# Patient Record
Sex: Female | Born: 1955 | Hispanic: No | Marital: Married | Smoking: Never smoker
Health system: Southern US, Community
[De-identification: ages and names within clinical notes are randomized; demographics above are authoritative.]

## PROBLEM LIST (undated history)

## (undated) DIAGNOSIS — Z6841 Body Mass Index (BMI) 40.0 and over, adult: Secondary | ICD-10-CM

## (undated) DIAGNOSIS — J45909 Unspecified asthma, uncomplicated: Secondary | ICD-10-CM

## (undated) DIAGNOSIS — I10 Essential (primary) hypertension: Secondary | ICD-10-CM

## (undated) DIAGNOSIS — E7800 Pure hypercholesterolemia, unspecified: Secondary | ICD-10-CM

## (undated) DIAGNOSIS — I350 Nonrheumatic aortic (valve) stenosis: Secondary | ICD-10-CM

## (undated) HISTORY — PX: TOTAL ABDOMINAL HYSTERECTOMY W/ BILATERAL SALPINGOOPHORECTOMY: SHX83

## (undated) HISTORY — DX: Body Mass Index (BMI) 40.0 and over, adult: Z684

## (undated) HISTORY — PX: DILATION AND CURETTAGE, DIAGNOSTIC / THERAPEUTIC: SUR384

## (undated) HISTORY — DX: Morbid (severe) obesity due to excess calories: E66.01

## (undated) HISTORY — DX: Nonrheumatic aortic (valve) stenosis: I35.0

---

## 2003-06-22 ENCOUNTER — Emergency Department (HOSPITAL_COMMUNITY): Admission: EM | Admit: 2003-06-22 | Discharge: 2003-06-22 | Payer: Self-pay | Admitting: Emergency Medicine

## 2003-06-24 ENCOUNTER — Ambulatory Visit (HOSPITAL_COMMUNITY): Admission: RE | Admit: 2003-06-24 | Discharge: 2003-06-24 | Payer: Self-pay | Admitting: Podiatry

## 2005-03-01 ENCOUNTER — Encounter (INDEPENDENT_AMBULATORY_CARE_PROVIDER_SITE_OTHER): Payer: Self-pay | Admitting: Family Medicine

## 2005-03-01 LAB — CONVERTED CEMR LAB: Microalbumin U total vol: 0.34 mg/L

## 2005-03-08 ENCOUNTER — Emergency Department (HOSPITAL_COMMUNITY): Admission: EM | Admit: 2005-03-08 | Discharge: 2005-03-09 | Payer: Self-pay | Admitting: Emergency Medicine

## 2005-03-30 ENCOUNTER — Ambulatory Visit: Payer: Self-pay | Admitting: Family Medicine

## 2005-04-02 ENCOUNTER — Encounter (INDEPENDENT_AMBULATORY_CARE_PROVIDER_SITE_OTHER): Payer: Self-pay | Admitting: Family Medicine

## 2005-04-05 ENCOUNTER — Ambulatory Visit (HOSPITAL_COMMUNITY): Admission: RE | Admit: 2005-04-05 | Discharge: 2005-04-05 | Payer: Self-pay | Admitting: Family Medicine

## 2005-05-01 ENCOUNTER — Encounter (INDEPENDENT_AMBULATORY_CARE_PROVIDER_SITE_OTHER): Payer: Self-pay | Admitting: Family Medicine

## 2005-05-01 ENCOUNTER — Ambulatory Visit: Payer: Self-pay | Admitting: Family Medicine

## 2005-06-13 ENCOUNTER — Ambulatory Visit: Payer: Self-pay | Admitting: Family Medicine

## 2005-06-13 ENCOUNTER — Ambulatory Visit: Payer: Self-pay | Admitting: *Deleted

## 2005-07-13 ENCOUNTER — Ambulatory Visit: Payer: Self-pay | Admitting: Family Medicine

## 2006-03-14 ENCOUNTER — Emergency Department (HOSPITAL_COMMUNITY): Admission: EM | Admit: 2006-03-14 | Discharge: 2006-03-15 | Payer: Self-pay | Admitting: Emergency Medicine

## 2006-04-30 ENCOUNTER — Ambulatory Visit: Payer: Self-pay | Admitting: Family Medicine

## 2006-08-09 ENCOUNTER — Encounter (INDEPENDENT_AMBULATORY_CARE_PROVIDER_SITE_OTHER): Payer: Self-pay | Admitting: Nurse Practitioner

## 2006-08-09 ENCOUNTER — Ambulatory Visit: Payer: Self-pay | Admitting: Internal Medicine

## 2006-08-09 LAB — CONVERTED CEMR LAB
Basophils Absolute: 0 10*3/uL (ref 0.0–0.1)
Basophils Relative: 1 % (ref 0–1)
Eosinophils Absolute: 0.1 10*3/uL (ref 0.0–0.7)
HCT: 39.3 % (ref 36.0–46.0)
Hemoglobin: 11.9 g/dL — ABNORMAL LOW (ref 12.0–15.0)
Monocytes Absolute: 0.6 10*3/uL (ref 0.2–0.7)
Monocytes Relative: 8 % (ref 3–11)
RBC: 4.53 M/uL (ref 3.87–5.11)

## 2006-08-20 ENCOUNTER — Encounter (INDEPENDENT_AMBULATORY_CARE_PROVIDER_SITE_OTHER): Payer: Self-pay | Admitting: Family Medicine

## 2006-08-20 DIAGNOSIS — E785 Hyperlipidemia, unspecified: Secondary | ICD-10-CM | POA: Insufficient documentation

## 2006-08-20 DIAGNOSIS — J309 Allergic rhinitis, unspecified: Secondary | ICD-10-CM | POA: Insufficient documentation

## 2006-08-20 DIAGNOSIS — N390 Urinary tract infection, site not specified: Secondary | ICD-10-CM | POA: Insufficient documentation

## 2006-08-20 DIAGNOSIS — I10 Essential (primary) hypertension: Secondary | ICD-10-CM | POA: Insufficient documentation

## 2006-08-20 DIAGNOSIS — J45909 Unspecified asthma, uncomplicated: Secondary | ICD-10-CM | POA: Insufficient documentation

## 2006-10-10 ENCOUNTER — Ambulatory Visit: Payer: Self-pay | Admitting: Nurse Practitioner

## 2006-10-17 ENCOUNTER — Encounter (INDEPENDENT_AMBULATORY_CARE_PROVIDER_SITE_OTHER): Payer: Self-pay | Admitting: Nurse Practitioner

## 2006-10-17 ENCOUNTER — Ambulatory Visit: Payer: Self-pay | Admitting: Family Medicine

## 2006-10-17 LAB — CONVERTED CEMR LAB
Basophils Relative: 1 % (ref 0–1)
Eosinophils Relative: 2 % (ref 0–5)
Lymphocytes Relative: 26 % (ref 12–46)
Monocytes Absolute: 0.4 10*3/uL (ref 0.2–0.7)
Neutro Abs: 5.2 10*3/uL (ref 1.7–7.7)
Platelets: 327 10*3/uL (ref 150–400)
RDW: 14.2 % — ABNORMAL HIGH (ref 11.5–14.0)

## 2006-10-18 ENCOUNTER — Encounter (INDEPENDENT_AMBULATORY_CARE_PROVIDER_SITE_OTHER): Payer: Self-pay | Admitting: Nurse Practitioner

## 2007-03-03 ENCOUNTER — Encounter (INDEPENDENT_AMBULATORY_CARE_PROVIDER_SITE_OTHER): Payer: Self-pay | Admitting: Family Medicine

## 2007-03-03 ENCOUNTER — Emergency Department (HOSPITAL_COMMUNITY): Admission: EM | Admit: 2007-03-03 | Discharge: 2007-03-03 | Payer: Self-pay | Admitting: Emergency Medicine

## 2007-03-04 ENCOUNTER — Telehealth (INDEPENDENT_AMBULATORY_CARE_PROVIDER_SITE_OTHER): Payer: Self-pay | Admitting: *Deleted

## 2007-03-05 ENCOUNTER — Ambulatory Visit: Payer: Self-pay | Admitting: Family Medicine

## 2007-03-05 DIAGNOSIS — N950 Postmenopausal bleeding: Secondary | ICD-10-CM | POA: Insufficient documentation

## 2007-03-06 ENCOUNTER — Other Ambulatory Visit: Admission: RE | Admit: 2007-03-06 | Discharge: 2007-03-06 | Payer: Self-pay | Admitting: Gynecology

## 2007-03-06 ENCOUNTER — Ambulatory Visit: Payer: Self-pay | Admitting: Gynecology

## 2007-03-10 ENCOUNTER — Telehealth (INDEPENDENT_AMBULATORY_CARE_PROVIDER_SITE_OTHER): Payer: Self-pay | Admitting: Family Medicine

## 2007-03-11 ENCOUNTER — Inpatient Hospital Stay (HOSPITAL_COMMUNITY): Admission: AD | Admit: 2007-03-11 | Discharge: 2007-03-11 | Payer: Self-pay | Admitting: Obstetrics & Gynecology

## 2007-03-20 ENCOUNTER — Ambulatory Visit: Payer: Self-pay | Admitting: Obstetrics & Gynecology

## 2007-04-01 ENCOUNTER — Ambulatory Visit: Payer: Self-pay | Admitting: Obstetrics & Gynecology

## 2007-04-01 ENCOUNTER — Encounter: Payer: Self-pay | Admitting: Obstetrics & Gynecology

## 2007-04-01 ENCOUNTER — Ambulatory Visit (HOSPITAL_COMMUNITY): Admission: RE | Admit: 2007-04-01 | Discharge: 2007-04-01 | Payer: Self-pay | Admitting: Obstetrics & Gynecology

## 2007-04-25 ENCOUNTER — Encounter: Payer: Self-pay | Admitting: Obstetrics & Gynecology

## 2007-04-25 ENCOUNTER — Ambulatory Visit: Payer: Self-pay | Admitting: Obstetrics & Gynecology

## 2007-05-08 ENCOUNTER — Ambulatory Visit (HOSPITAL_COMMUNITY): Admission: RE | Admit: 2007-05-08 | Discharge: 2007-05-08 | Payer: Self-pay | Admitting: Obstetrics & Gynecology

## 2007-09-09 ENCOUNTER — Telehealth (INDEPENDENT_AMBULATORY_CARE_PROVIDER_SITE_OTHER): Payer: Self-pay | Admitting: *Deleted

## 2007-12-11 IMAGING — MG MS DIGITAL SCREENING BILAT
4 series · 4 of 4 positions shown · non-contrast
Comparison: none

SCREENING MAMMOGRAM:
There is a fibroglandular pattern.  No masses or malignant type calcifications are identified.

[R CC]
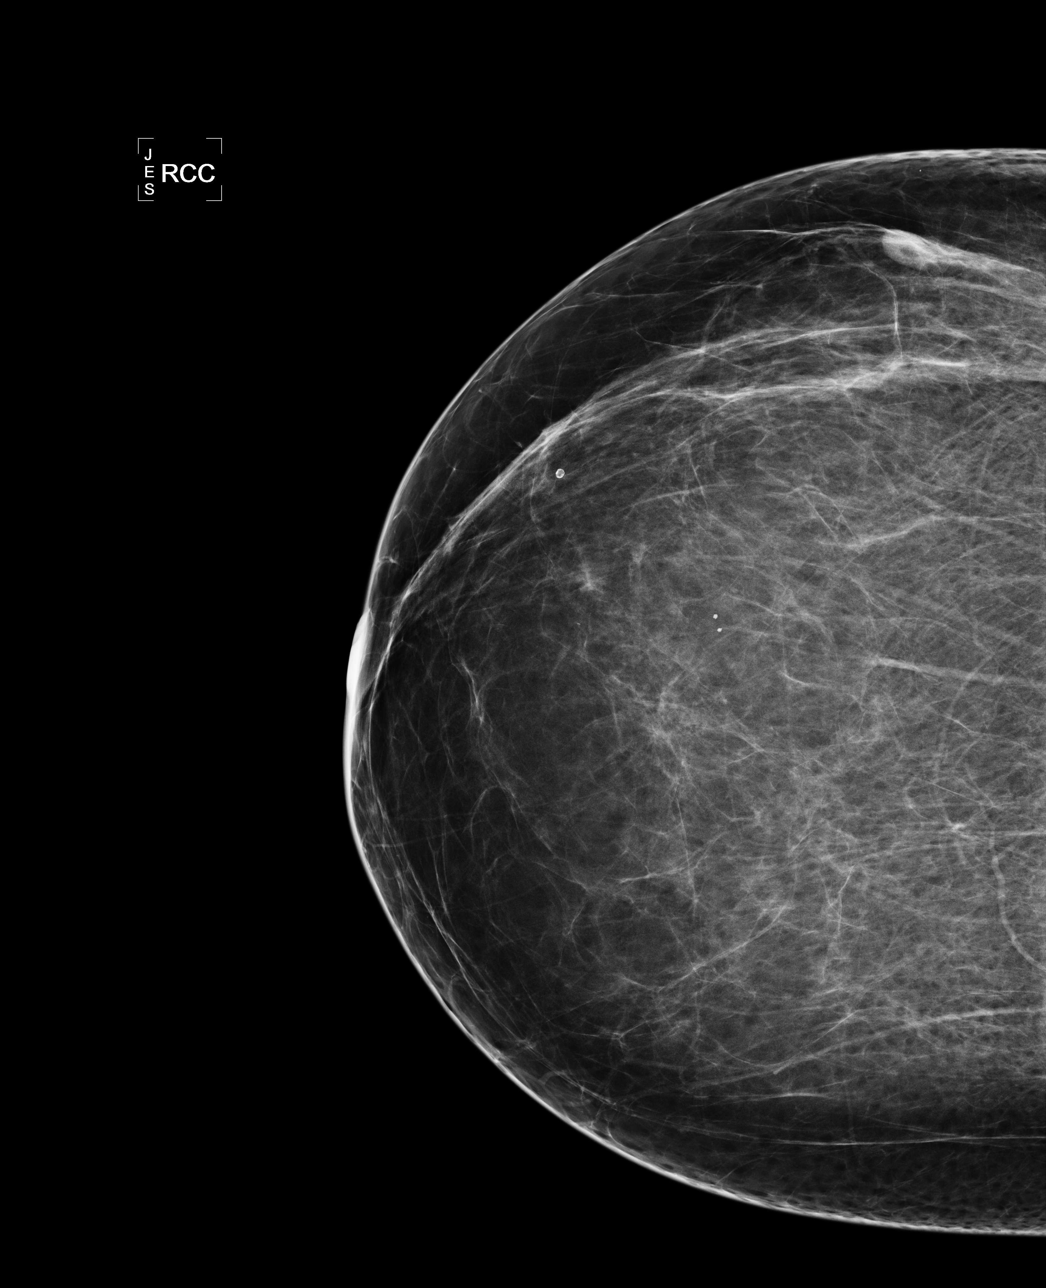

[R MLO]
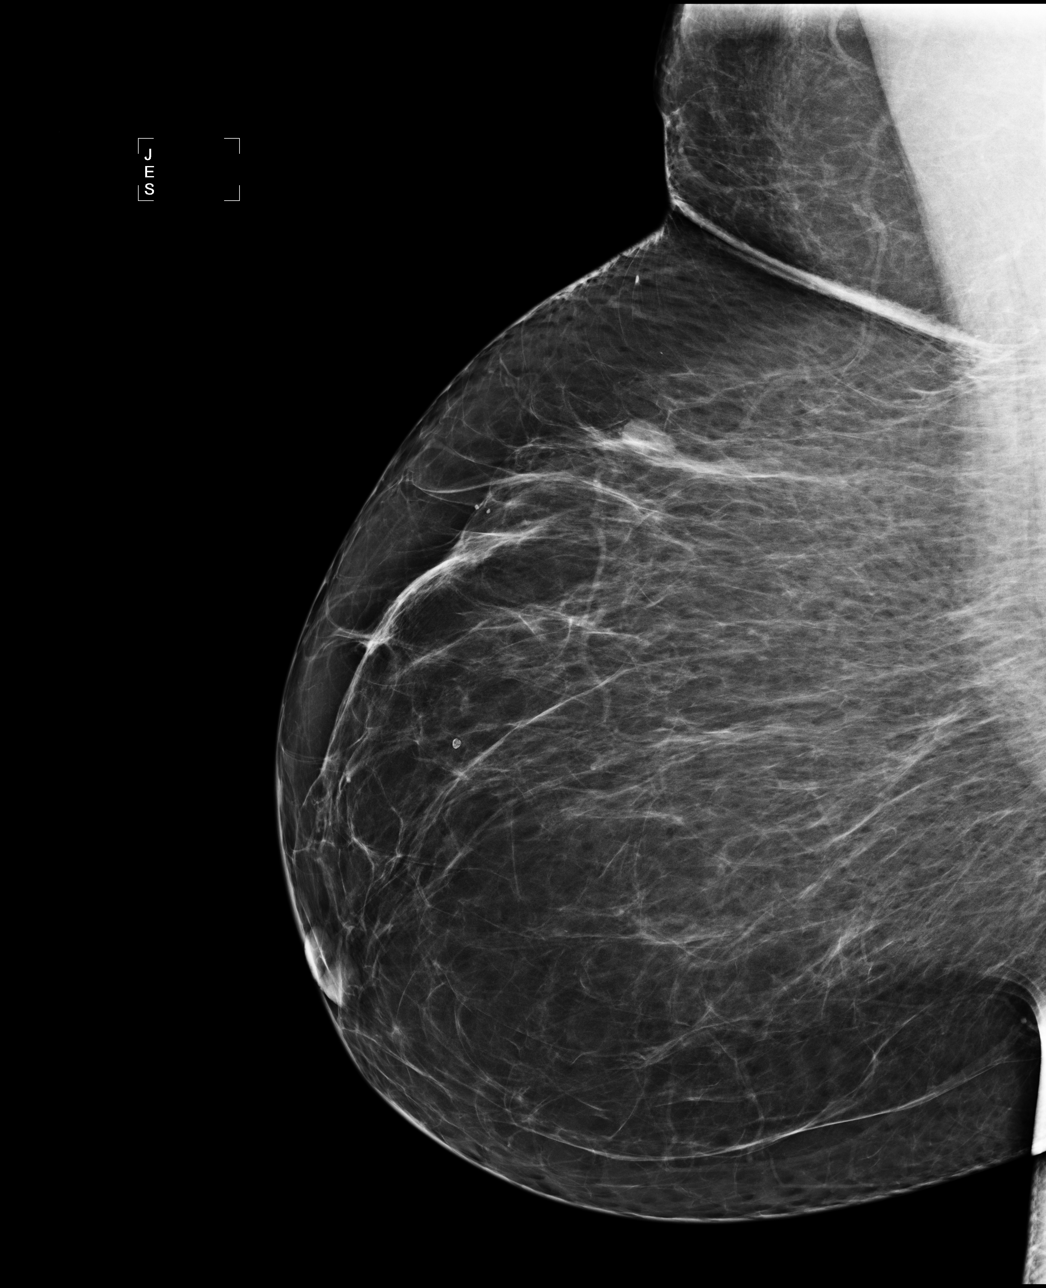

[L CC]
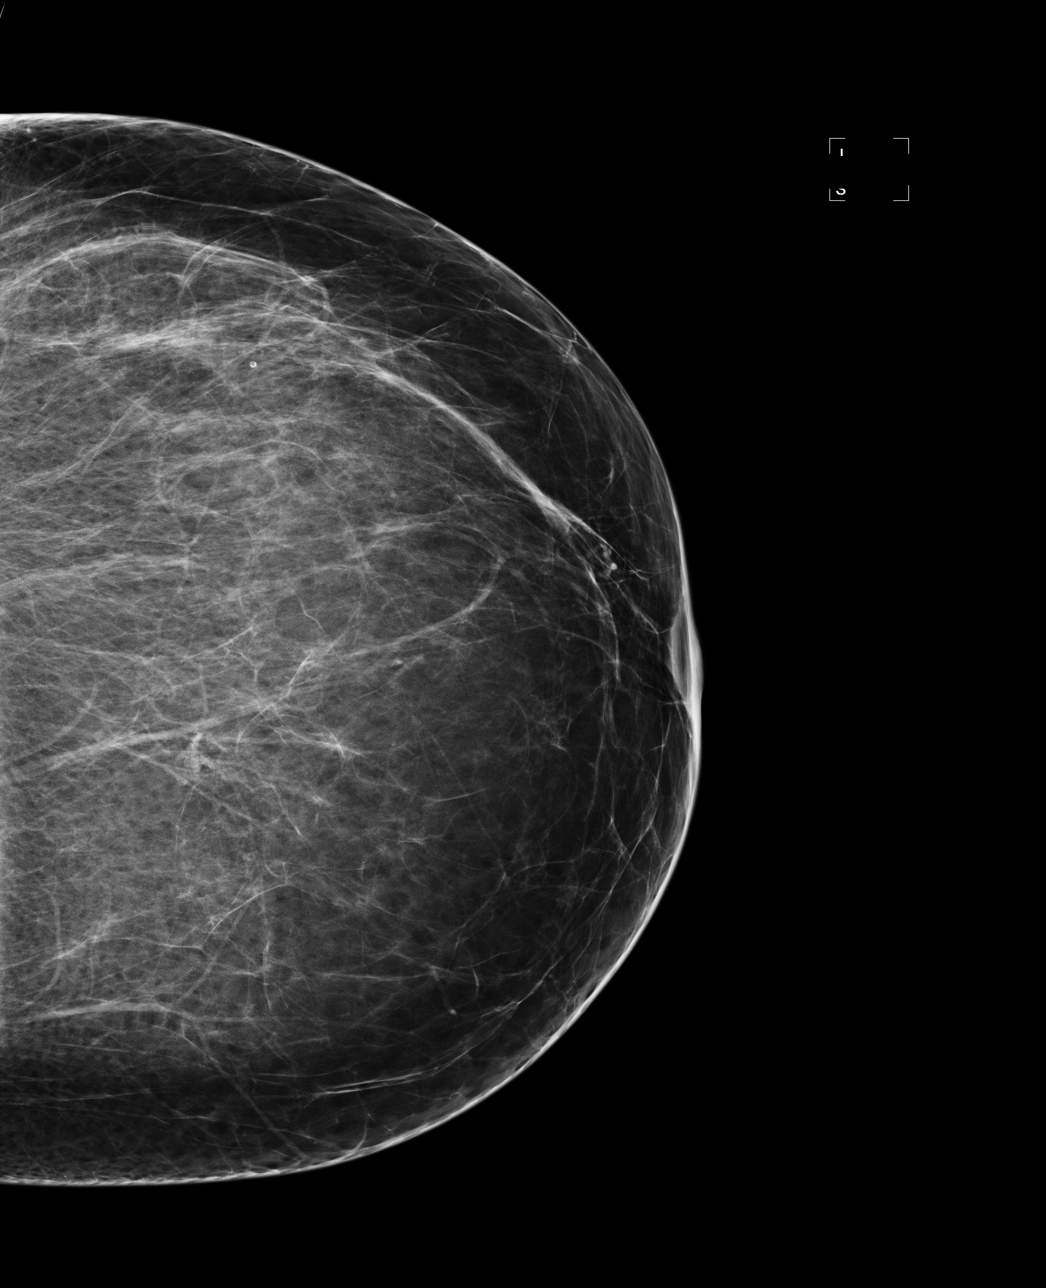

[L MLO]
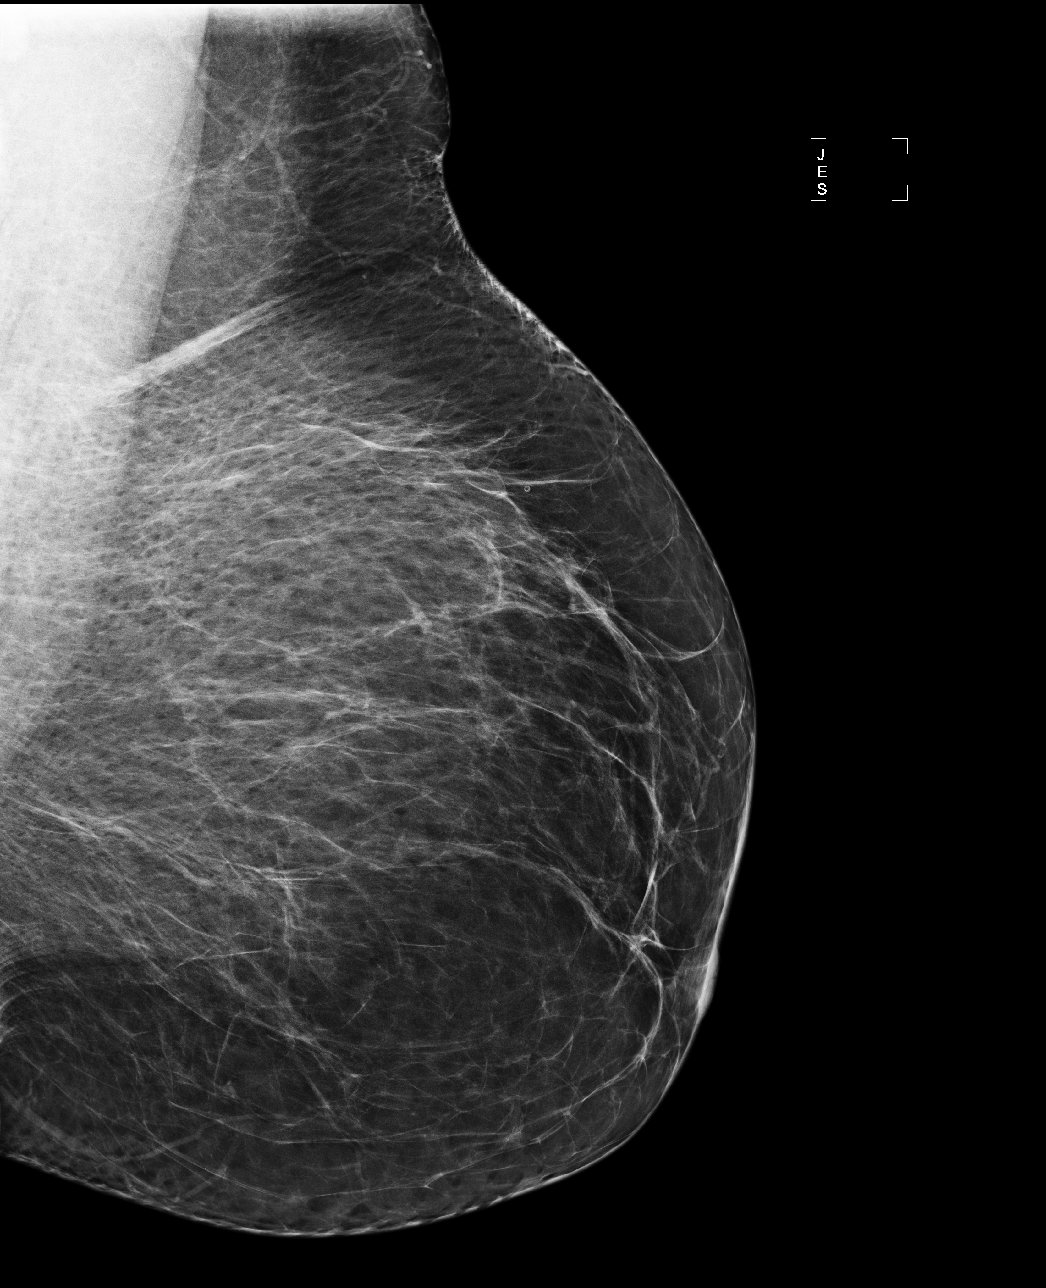

[4 of 4 positions shown; findings below may reference images not displayed]

IMPRESSION: No specific mammographic evidence of malignancy.  Consider screening MRI every 1-2 years given the 
patient's high risk for breast cancer. Next screening mammogram is recommended in one year.

ASSESSMENT: Negative - BI-RADS 1

Screening mammogram in 1 year.

## 2008-05-04 ENCOUNTER — Telehealth (INDEPENDENT_AMBULATORY_CARE_PROVIDER_SITE_OTHER): Payer: Self-pay | Admitting: Family Medicine

## 2008-11-20 ENCOUNTER — Inpatient Hospital Stay (HOSPITAL_COMMUNITY): Admission: AD | Admit: 2008-11-20 | Discharge: 2008-11-20 | Payer: Self-pay | Admitting: Obstetrics & Gynecology

## 2009-11-07 IMAGING — US US PELVIS COMPLETE MODIFY
1 series · 13 of 25 positions shown · non-contrast
Comparison: none

Colon postmenopausal bleeding

ULTRASOUND PELVIS TRANSABDOMINAL COMPLETE MODIFIED:
ULTRASOUND PELVIS TRANSVAGINAL NON-OB:
Transabdominal and endovaginal sonography of pelvis performed.
Uterus, ovaries, adnexal regions, and pelvic cul-de-sac assessed.
Uterus enlarged measuring 14.7 cm length x 6.2 cm AP x 6.5 cm transverse pre-
Endometrial complex markedly thickened and poorly defined, 15 mm diameter.
Cannot exclude malignancy with this appearance.
Additionally, a poorly defined slightly echogenic focus with enhanced
through-transmission is seen at the posterior aspect of the mid uterine segment,
6 x 6 x 7 mm, uncertain significance.
Tiny nabothian cysts and cervix.
No endometrial fluid or free pelvic fluid.
Right ovary is not visualized on either transabdominal or endovaginal imaging.
Left ovary measures 2.8 x 2.6 x 2.3 cm and contains a 1.7 cm diameter cyst with
simple features.
No other adnexal masses.

[Series 1: unknown · 0.33mm/px · 13 of 40 slices shown]
[im 1/40]
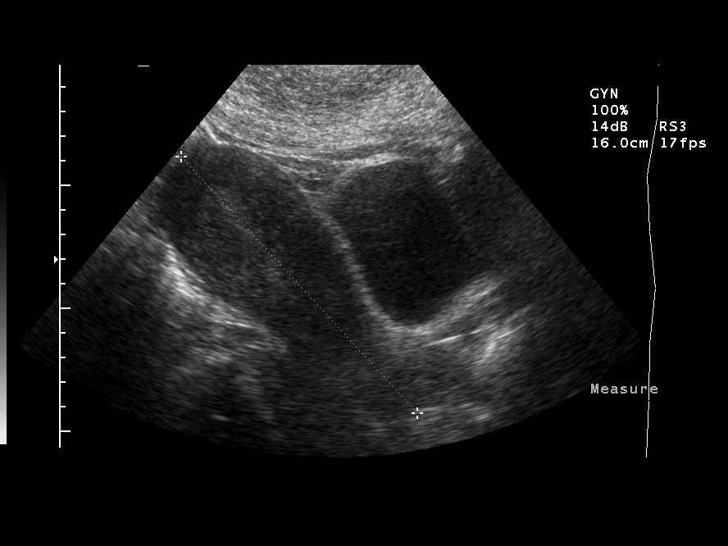
[im 4/40]
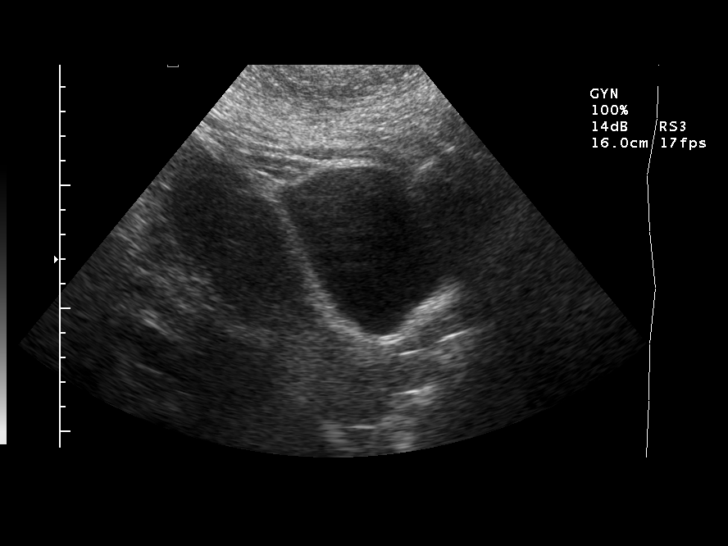
[im 7/40]
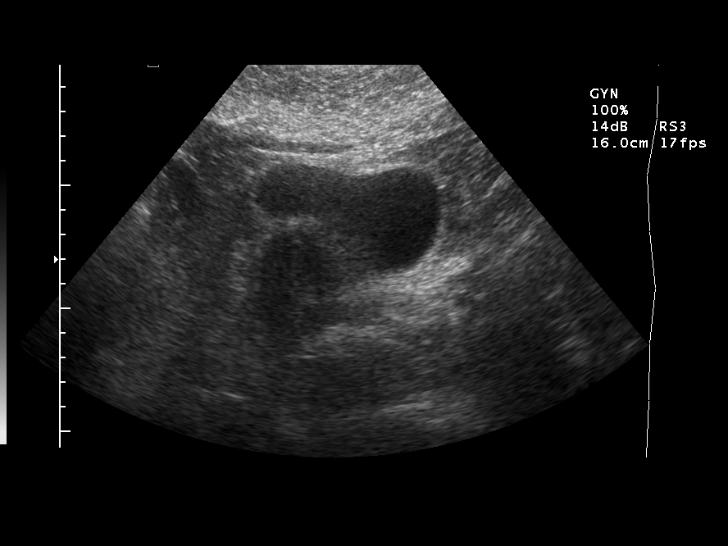
[im 10/40]
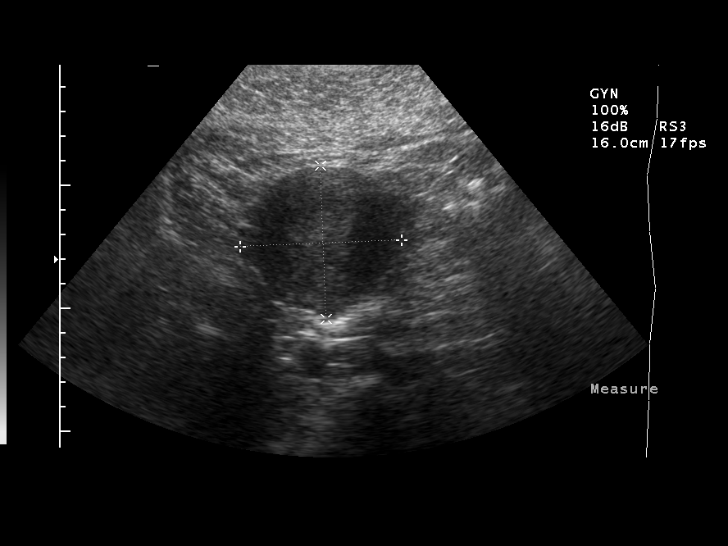
[im 14/40]
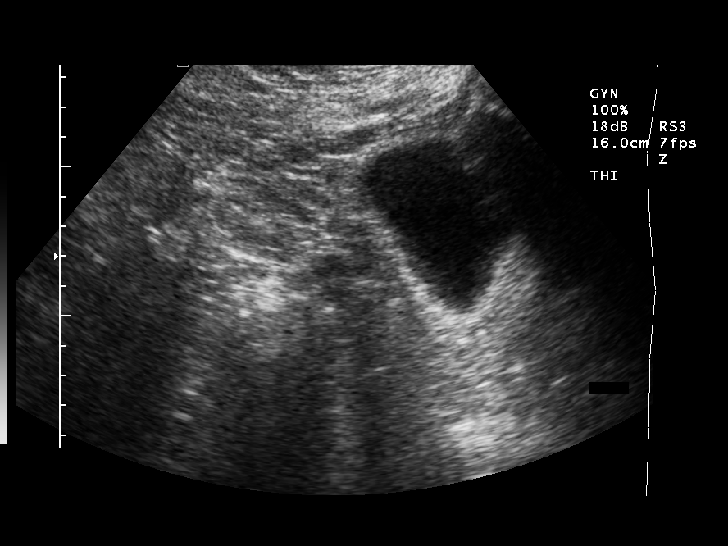
[im 17/40]
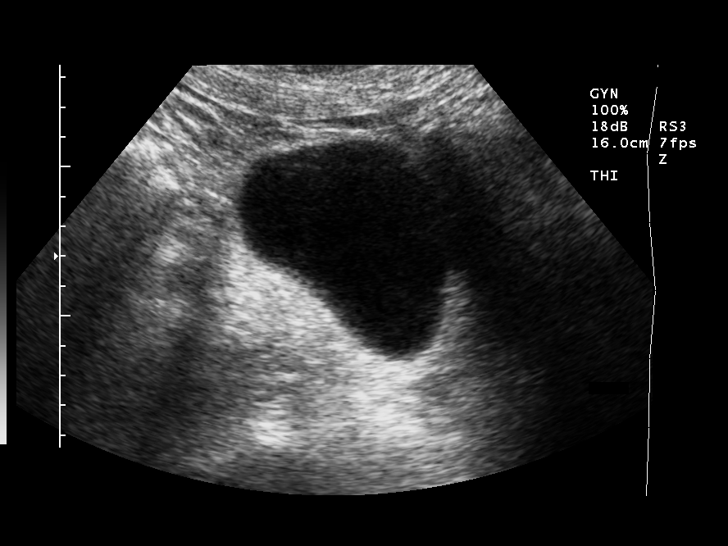
[im 20/40]
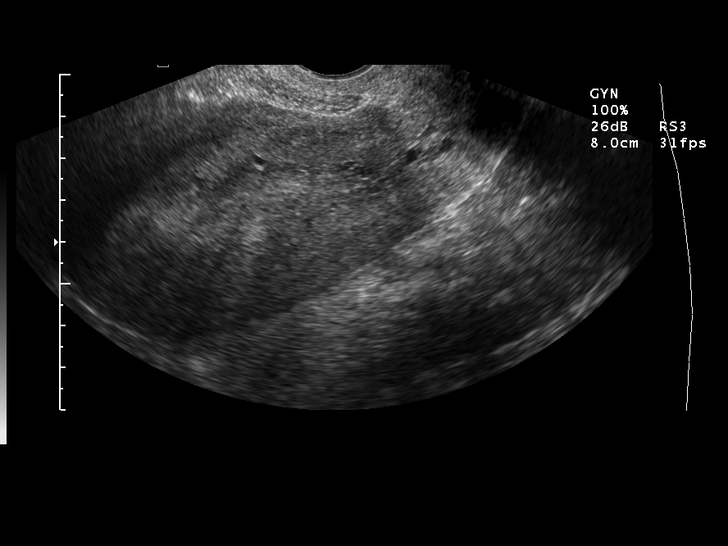
[im 23/40]
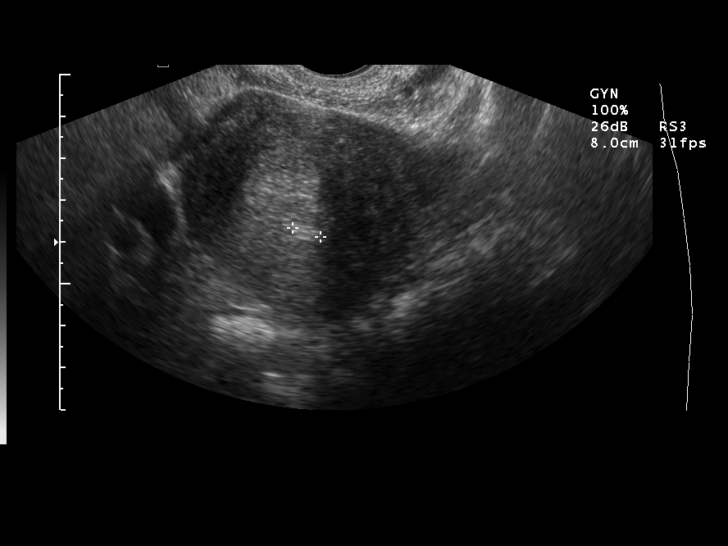
[im 27/40]
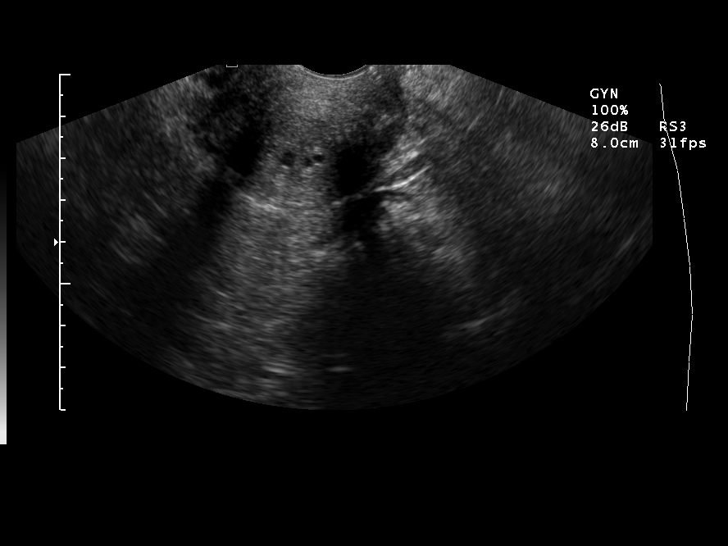
[im 30/40]
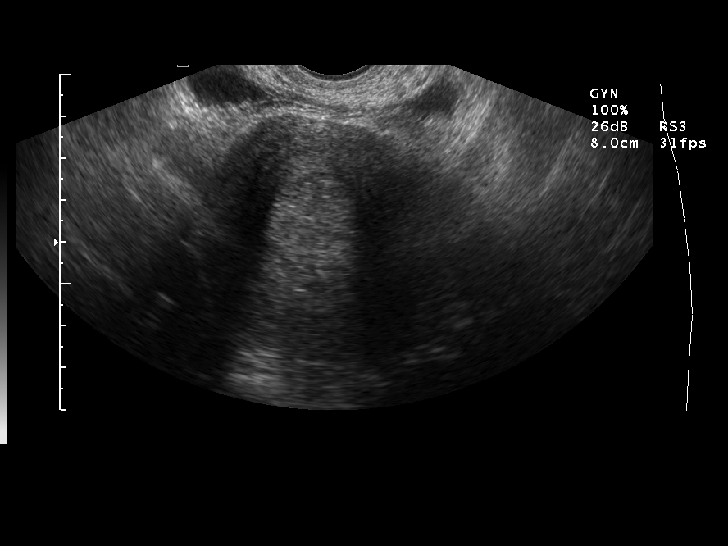
[im 33/40]
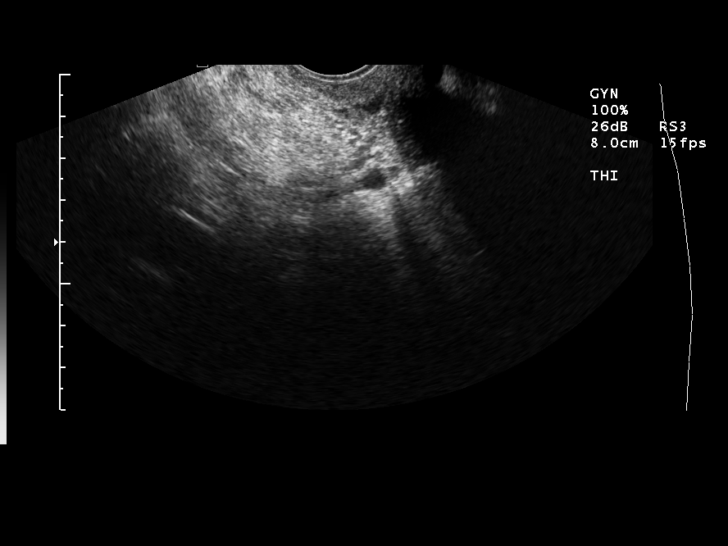
[im 36/40]
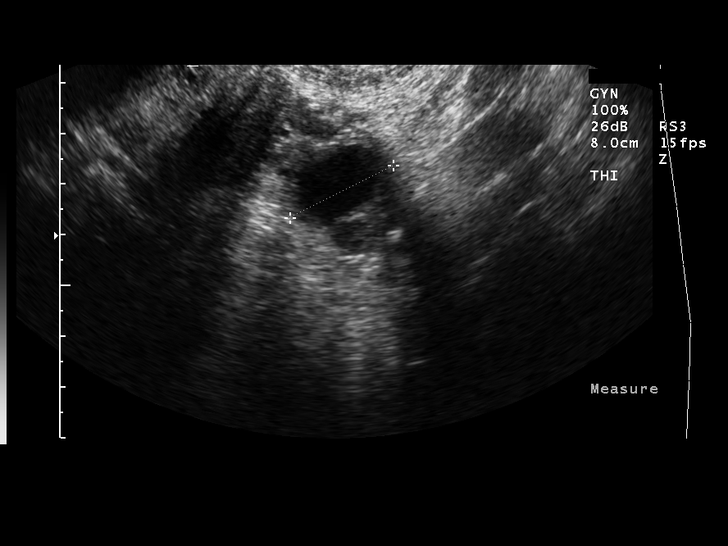
[im 40/40]
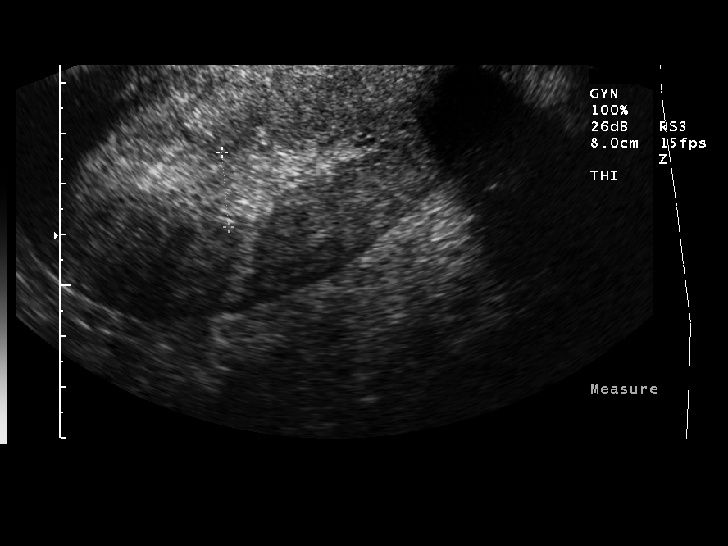

[13 of 25 positions shown; findings below may reference images not displayed]

IMPRESSION: Tiny left ovarian cyst.
Nonvisualization right ovary.
Markedly abnormal appearance of uterus, with overall uterine wall enlargement,
markedly thickened and poorly defined endometrial complex, cannot exclude
endometrial malignancy, tissue diagnosis recommended.

## 2010-04-05 LAB — URINALYSIS, ROUTINE W REFLEX MICROSCOPIC
Bilirubin Urine: NEGATIVE
Hgb urine dipstick: NEGATIVE
Protein, ur: NEGATIVE mg/dL
Specific Gravity, Urine: 1.02 (ref 1.005–1.030)

## 2010-04-05 LAB — URINE MICROSCOPIC-ADD ON

## 2010-05-16 NOTE — H&P (Signed)
NAME:  Alison, Beard NO.:  [id number]   MEDICAL RECORD NO.:  [id number]          PATIENT TYPE:  WOC   LOCATION:  [facility]                   FACILITY:  [facility]   PHYSICIAN:  Allie Bossier, MD        DATE OF BIRTH:  04/03/1955   DATE OF SERVICE:                           PRE-OP HISTORY & PHYSICAL   [name] is a 55 year old middle eastern gravida 4, para 4 who had a  new onset of postmenopausal bleeding 10 weeks ago.  She has been  menopausal for approximately 10 years.  She was evaluated in the GYN  clinic and had an endometrial biopsy, which showed no abnormalities.  An  ultrasound showed a markedly abnormal appearance of the uterus with  markedly thickened and poorly defined endometrial complex, cannot  exclude endometrial malignancy.  The uterine lining was poorly defined  but measured at least 15 mm in diameter.  Additionally, a poorly defined  echogenic focus at the mid uterine segment was seen as well.  Her  ovaries appeared normal.  She was started on Provera and had some  decreasing of her postmenopausal bleeding; however, she was told to stop  it, and her bleeding recurred.  She was seen in the emergency room and  was hemodynamically stable.  I have told her that we would need to  schedule a hysteroscopy and D&C for further evaluation and treatment of  this issue.   PAST MEDICAL HISTORY:  1. Obesity.  2. Hypertension.   MEDICATIONS:  Hydrochlorothiazide 25 mg daily.   No known drug allergies.   SOCIAL HISTORY:  Negative for tobacco, alcohol, or drug use.   FAMILY HISTORY:  Negative for breast, GYN and colon malignancies.   PHYSICAL EXAMINATION:  VITAL SIGNS:  Stable.  Afebrile.  GENERAL:  Well-nourished, well-hydrated, moderately obese female.  She  understands some English but uses her daughter, Alison Beard, as a Nurse, learning disability.  HEENT:  Normal.  HEART:  Regular rate and rhythm.  LUNGS:  Clear to auscultation bilaterally.  BREASTS:  Normal.  Bimanual exam reveals an 8 week size uterus and nonenlarged adnexa.   ASSESSMENT/PLAN:  Postmenopausal bleeding with thickened endometrium.  We will proceed with a D&C hysteroscopy.  I have explained the risks of  surgery, including but not limited to infection and bleeding.      Allie Bossier, MD     MCD/MEDQ  D:  03/12/2007  T:  03/12/2007  Job:  [id number]

## 2010-05-16 NOTE — Op Note (Signed)
NAME:  Alison Beard, Alison Beard            ACCOUNT NO.:  [id number]   MEDICAL RECORD NO.:  [id number]          PATIENT TYPE:  AMB   LOCATION:                                [facility]   PHYSICIAN:  Allie Bossier, MD        DATE OF BIRTH:  02-27-1955   DATE OF PROCEDURE:  DATE OF DISCHARGE:  04/01/2007                               OPERATIVE REPORT   PREOPERATIVE DIAGNOSES:  1. Postmenopausal bleeding.  2. Morbid obesity.  3. Hypertension.   POSTOPERATIVE DIAGNOSES:  1. Postmenopausal bleeding.  2. Morbid obesity.  3. Hypertension.   PROCEDURE PERFORMED:  Diagnostic hysteroscopy and D and C.   SURGEON:  Myra C. Marice Potter, MD.   ANESTHESIA:  LMA.   ANESTHESIOLOGISTSherron Ales, M.D.   COMPLICATIONS:  None.   ESTIMATED BLOOD LOSS:  Minimal.   SPECIMENS:  Uterine curettings.   DETAILED PROCEDURE FINDINGS:  The risks, benefits, and alternatives of  surgery were explained, the nursing accepting, consents were signed.  She was taken to the operating room and general anesthesia was no  problem without complication.  She was placed in the dorsal lithotomy  position.  Her vagina was prepped and draped in the usual sterile  fashion.  A bimanual exam revealed a normal size and shape anteverted  uterus and non-enlarged adnexa.  A speculum was placed.  The anterior  lip of the cervix was grasped with a single-tooth tenaculum.  A single-  tooth tenaculum was placed on the anterior lip of the cervix.  The  cervix was gradually and easily dilated to accommodate the diagnostic  hysteroscope.  Glycine was the distention medium and the uterus was  inspected fully.  The fundal portion of the uterus with the ostia  plainly visualized, was completely atrophic.  The posterior wall of the  uterus, however, had a moderate amount of polypoid material.  Sharp  curettage was done in all quadrants including and the fundus of the  uterus until the gritty sensation was appreciated throughout.  Hysteroscopy  was repeated and  the polypoid tissue had been removed.  There was a net loss of  approximately 30 mL of the distention medium.  The tenaculum was  removed.  No bleeding was noted from the site.  She was extubated and  taken to the recovery room in stable condition, with the instrument,  sponge, and needle counts correct.      Allie Bossier, MD  Electronically Signed     MCD/MEDQ  D:  04/01/2007  T:  04/01/2007  Job:  [id number]

## 2010-05-16 NOTE — Group Therapy Note (Signed)
NAME:  Alison Beard, SCHIMPF NO.:  [id number]   MEDICAL RECORD NO.:  [id number]          PATIENT TYPE:  WOC   LOCATION:  [facility]                   FACILITY:  [facility]   PHYSICIAN:  Ginger Carne, MD DATE OF BIRTH:  1955-11-21   DATE OF SERVICE:  03/06/2007                                  CLINIC NOTE   REASON FOR VISIT:  Postmenopausal bleeding.  This patient is a 55-year-  old female status post menopause for 10 years who presents with a 1-week  history of moderate to light postmenopausal bleeding.  The patient says  that this is the first time since menopause that she has noted this sort  of bleeding.  She is a G5 P5 and is in good health.  The remainder of  her history can be followed in her chart.  She takes hydrochlorothiazide  for hypertension and has been on Provera 10 mg daily since March 06, 2007  for bleeding.   A pelvic sonogram obtained on 2, 2009 demonstrated a 15 mm endometrial  thickness with an enlarged uterus of 14.7 cm and full sagittal length.  Left and right ovaries appeared normal.   PHYSICAL EXAM:  EXTERNAL GENITALIA:  Vulva and vagina normal.  Cervix  smooth without erosions or lesions.  Blood was noted at the os.  Uterus  sounded to 10 cm.  Tissue was obtained and sent for pathology on  endometrial biopsy.  On palpation, uterus was about 12 to 14 weeks, but  the patient is difficult to examine and both adnexa are difficult to  palpate due to the patient's size.   IMPRESSION:  Postmenopausal bleeding.   PLAN:  Await results of endometrial biopsy to determine next course of  action.  If insufficient tissue is noted or questionable diagnosis, I  would recommend a hysteroscopy with dilation and curettage.           ______________________________  Ginger Carne, MD     SHB/MEDQ  D:  03/06/2007  T:  03/06/2007  Job:  [id number]

## 2010-09-25 LAB — PREGNANCY, URINE: Preg Test, Ur: NEGATIVE

## 2010-09-25 LAB — CBC
HCT: 34.5 — ABNORMAL LOW
HCT: 33 — ABNORMAL LOW
Hemoglobin: 11.6 — ABNORMAL LOW
MCV: 81.7
Platelets: 289
WBC: 7.9

## 2010-09-25 LAB — POCT I-STAT CREATININE: Operator id: 294501

## 2010-09-25 LAB — I-STAT 8, (EC8 V) (CONVERTED LAB)
BUN: 13
Chloride: 103
Glucose, Bld: 108 — ABNORMAL HIGH
Operator id: 294501
Potassium: 3.6
TCO2: 29
pH, Ven: 7.513 — ABNORMAL HIGH

## 2010-09-25 LAB — BASIC METABOLIC PANEL
Calcium: 9.3
Creatinine, Ser: 0.6
GFR calc Af Amer: >60
GFR calc non Af Amer: >60

## 2010-09-25 LAB — URINALYSIS, ROUTINE W REFLEX MICROSCOPIC
Leukocytes, UA: NEGATIVE
Nitrite: NEGATIVE
Specific Gravity, Urine: 1.017
pH: 7.5

## 2010-09-25 LAB — DIFFERENTIAL
Basophils Relative: 1
Lymphocytes Relative: 23
Lymphs Abs: 1.7

## 2010-09-25 LAB — URINE MICROSCOPIC-ADD ON

## 2010-09-25 LAB — SAMPLE TO BLOOD BANK

## 2010-09-25 LAB — HEMOGLOBIN AND HEMATOCRIT, BLOOD: HCT: 29.9 — ABNORMAL LOW

## 2012-01-14 ENCOUNTER — Encounter (HOSPITAL_COMMUNITY): Payer: Self-pay | Admitting: *Deleted

## 2012-01-14 ENCOUNTER — Emergency Department (HOSPITAL_COMMUNITY): Payer: Self-pay

## 2012-01-14 ENCOUNTER — Emergency Department (HOSPITAL_COMMUNITY)
Admission: EM | Admit: 2012-01-14 | Discharge: 2012-01-14 | Disposition: A | Discharge status: Home / Self Care | Payer: Self-pay | Attending: Emergency Medicine | Admitting: Emergency Medicine

## 2012-01-14 DIAGNOSIS — R5383 Other fatigue: Secondary | ICD-10-CM | POA: Insufficient documentation

## 2012-01-14 DIAGNOSIS — R05 Cough: Secondary | ICD-10-CM | POA: Insufficient documentation

## 2012-01-14 DIAGNOSIS — Z79899 Other long term (current) drug therapy: Secondary | ICD-10-CM | POA: Insufficient documentation

## 2012-01-14 DIAGNOSIS — R0602 Shortness of breath: Secondary | ICD-10-CM | POA: Insufficient documentation

## 2012-01-14 DIAGNOSIS — I10 Essential (primary) hypertension: Secondary | ICD-10-CM | POA: Insufficient documentation

## 2012-01-14 DIAGNOSIS — J45909 Unspecified asthma, uncomplicated: Secondary | ICD-10-CM | POA: Insufficient documentation

## 2012-01-14 DIAGNOSIS — R059 Cough, unspecified: Secondary | ICD-10-CM | POA: Insufficient documentation

## 2012-01-14 DIAGNOSIS — R6883 Chills (without fever): Secondary | ICD-10-CM | POA: Insufficient documentation

## 2012-01-14 DIAGNOSIS — R5381 Other malaise: Secondary | ICD-10-CM | POA: Insufficient documentation

## 2012-01-14 DIAGNOSIS — R079 Chest pain, unspecified: Secondary | ICD-10-CM | POA: Insufficient documentation

## 2012-01-14 DIAGNOSIS — J3489 Other specified disorders of nose and nasal sinuses: Secondary | ICD-10-CM | POA: Insufficient documentation

## 2012-01-14 HISTORY — DX: Pure hypercholesterolemia, unspecified: E78.00

## 2012-01-14 HISTORY — DX: Essential (primary) hypertension: I10

## 2012-01-14 LAB — CBC WITH DIFFERENTIAL/PLATELET
Basophils Absolute: 0 10*3/uL (ref 0.0–0.1)
Eosinophils Relative: 5 % (ref 0–5)
Lymphocytes Relative: 23 % (ref 12–46)
Lymphs Abs: 1.3 10*3/uL (ref 0.7–4.0)
MCV: 83.6 fL (ref 78.0–100.0)
Neutro Abs: 3.3 10*3/uL (ref 1.7–7.7)
Neutrophils Relative %: 61 % (ref 43–77)
Platelets: 258 10*3/uL (ref 150–400)
RBC: 5.32 MIL/uL — ABNORMAL HIGH (ref 3.87–5.11)
RDW: 13.2 % (ref 11.5–15.5)
WBC: 5.5 10*3/uL (ref 4.0–10.5)

## 2012-01-14 LAB — COMPREHENSIVE METABOLIC PANEL
ALT: 21 U/L (ref 0–35)
AST: 24 U/L (ref 0–37)
Alkaline Phosphatase: 85 U/L (ref 39–117)
CO2: 25 mEq/L (ref 19–32)
Calcium: 10.2 mg/dL (ref 8.4–10.5)
GFR calc non Af Amer: >90 mL/min (ref >90)
Glucose, Bld: 100 mg/dL — ABNORMAL HIGH (ref 70–99)
Potassium: 3.7 mEq/L (ref 3.5–5.1)
Sodium: 139 mEq/L (ref 135–145)

## 2012-01-14 MED ORDER — PREDNISONE 20 MG PO TABS: 60.0000 mg | ORAL_TABLET | Freq: Every day | ORAL | Status: AC

## 2012-01-14 MED ORDER — IPRATROPIUM BROMIDE 0.02 % IN SOLN
0.5000 mg | Freq: Once | RESPIRATORY_TRACT | Status: AC
  Administered 2012-01-14: 0.5 mg via RESPIRATORY_TRACT
  Filled 2012-01-14: qty 2.5

## 2012-01-14 MED ORDER — AZITHROMYCIN 250 MG PO TABS
500.0000 mg | ORAL_TABLET | Freq: Once | ORAL | Status: AC
  Administered 2012-01-14: 500 mg via ORAL
  Filled 2012-01-14: qty 2

## 2012-01-14 MED ORDER — ALBUTEROL SULFATE (5 MG/ML) 0.5% IN NEBU
5.0000 mg | INHALATION_SOLUTION | Freq: Once | RESPIRATORY_TRACT | Status: AC
  Administered 2012-01-14: 5 mg via RESPIRATORY_TRACT
  Filled 2012-01-14: qty 1

## 2012-01-14 MED ORDER — AZITHROMYCIN 250 MG PO TABS: 250.0000 mg | ORAL_TABLET | Freq: Every day | ORAL | Status: AC

## 2012-01-14 MED ORDER — PREDNISONE 20 MG PO TABS
60.0000 mg | ORAL_TABLET | Freq: Once | ORAL | Status: AC
  Administered 2012-01-14: 60 mg via ORAL
  Filled 2012-01-14: qty 3

## 2012-01-14 MED ORDER — HYDROCODONE-ACETAMINOPHEN 7.5-325 MG/15ML PO SOLN: 15.0000 mL | Freq: Four times a day (QID) | ORAL | Status: DC | PRN

## 2012-01-14 MED ORDER — HYDROCODONE-ACETAMINOPHEN 5-325 MG PO TABS
1.0000 | ORAL_TABLET | Freq: Once | ORAL | Status: AC
  Administered 2012-01-14: 1 via ORAL
  Filled 2012-01-14: qty 1

## 2012-01-14 NOTE — ED Provider Notes (Signed)
History     CSN: [id number]  Arrival date & time 01/14/12  1219   First MD Initiated Contact with Patient 01/14/12 1617      No chief complaint on file.   (Consider location/radiation/quality/duration/timing/severity/associated sxs/prior treatment) Patient is a 57 y.o. female presenting with wheezing. The history is provided by the patient and a relative.  Wheezing  The current episode started 2 days ago. The problem has been gradually worsening. The problem is moderate. Associated symptoms include chest pain, cough, shortness of breath and wheezing. Pertinent negatives include no fever, no rhinorrhea and no sore throat. Associated symptoms comments: She has had symptoms or cough, congestion, fatigue, generalized weakness and wheezing. She reports similar symptoms every year at this time. She uses an inhaler as needed at home but reports wheezing and cough are not controlled. Chills but no known fever. She reports the cough is worse with lying down at night and keeps her awake. Marland Kitchen    Past Medical History  Diagnosis Date  . Hypertension   . Hypercholesteremia     Past Surgical History  Procedure Date  . Dilation and curettage, diagnostic / therapeutic     No family history on file.  History  Substance Use Topics  . Smoking status: Never Smoker   . Smokeless tobacco: Not on file  . Alcohol Use: No    OB History    Grav Para Term Preterm Abortions TAB SAB Ect Mult Living                  Review of Systems  Constitutional: Positive for chills and fatigue. Negative for fever.  HENT: Positive for congestion. Negative for sore throat and rhinorrhea.   Respiratory: Positive for cough, shortness of breath and wheezing.   Cardiovascular: Positive for chest pain. Negative for leg swelling.  Gastrointestinal: Negative.  Negative for nausea and abdominal pain.  Genitourinary: Negative for dysuria.  Musculoskeletal: Positive for myalgias.  Skin: Negative.  Negative for rash.    Neurological: Positive for weakness.  Psychiatric/Behavioral: Negative for confusion.    Allergies  Review of patient's allergies indicates no known allergies.  Home Medications   Current Outpatient Rx  Name  Route  Sig  Dispense  Refill  . HYDROCHLOROTHIAZIDE 25 MG PO TABS   Oral   Take 25 mg by mouth daily.         Marland Kitchen LOVASTATIN 20 MG PO TABS   Oral   Take 20 mg by mouth at bedtime.           BP 148/77  Pulse 84  Temp 98.3 F (36.8 C) (Oral)  Resp 28  SpO2 98%  Physical Exam  Constitutional: She is oriented to person, place, and time. She appears well-developed and well-nourished. No distress.  HENT:  Head: Normocephalic.  Mouth/Throat: Oropharynx is clear and moist.  Neck: Normal range of motion. Neck supple.  Cardiovascular: Normal rate and regular rhythm.   No murmur heard. Pulmonary/Chest: Effort normal. She has wheezes. She exhibits no tenderness.       Decreased air movement; end expiratory wheezing.  Abdominal: Soft. Bowel sounds are normal. There is no tenderness. There is no rebound and no guarding.  Musculoskeletal: Normal range of motion. She exhibits no edema.  Neurological: She is alert and oriented to person, place, and time.  Skin: Skin is warm and dry. No rash noted.  Psychiatric: She has a normal mood and affect.    ED Course  Procedures (including critical care time)  Labs  Reviewed  CBC WITH DIFFERENTIAL - Abnormal; Notable for the following:    RBC 5.32 (*)     All other components within normal limits  COMPREHENSIVE METABOLIC PANEL - Abnormal; Notable for the following:    Glucose, Bld 100 (*)     Creatinine, Ser 0.48 (*)     Total Protein 8.6 (*)     All other components within normal limits   Dg Chest 2 View  01/14/2012  *RADIOLOGY REPORT*  Clinical Data: Shortness of breath, history asthma, hypertension  CHEST - 2 VIEW  Comparison: 03/14/2006  Findings: Enlargement of cardiac silhouette. Minimal pulmonary vascular congestion.  Atherosclerotic calcification aortic arch. Minimal prominence in left hilum appears stable. Mild bronchitic changes without pulmonary infiltrate, pleural effusion or pneumothorax. No acute osseous findings. Bilateral cervical ribs.  IMPRESSION: Enlargement of cardiac silhouette with minimal pulmonary vascular congestion. Mild bronchitic changes.   Original Report Authenticated By: Ulyses Southward, M.D.      No diagnosis found. 1. Bronchitis 2. Asthma    MDM  Increase air movement after nebulizer treatment that improves with subsequent treatments. She reports feeling much better and appears more comfortable. Ambulatory without SOB, decreased oxygen saturation or increased cough.        Arnoldo Hooker, PA-C 01/14/12 2102

## 2012-01-14 NOTE — ED Notes (Signed)
Pt speaks Arabic and per interpretor phone pt states when she has a cold she gets asthma attack and cannot breath.  Pt is coughing up sputum.  No fever.  No pain and reports shortness of breath and fatigue.  Pt appears weak and pale.

## 2012-01-15 NOTE — ED Provider Notes (Signed)
Medical screening examination/treatment/procedure(s) were performed by non-physician practitioner and as supervising physician I was immediately available for consultation/collaboration.   Candiss Galeana J. Janelle Culton, MD 01/15/12 2357 

## 2013-03-20 ENCOUNTER — Encounter (HOSPITAL_COMMUNITY): Payer: Self-pay | Admitting: Emergency Medicine

## 2013-03-20 ENCOUNTER — Emergency Department (HOSPITAL_COMMUNITY): Payer: 59

## 2013-03-20 ENCOUNTER — Emergency Department (HOSPITAL_COMMUNITY)
Admission: EM | Admit: 2013-03-20 | Discharge: 2013-03-20 | Disposition: A | Discharge status: Home / Self Care | Payer: 59 | Attending: Emergency Medicine | Admitting: Emergency Medicine

## 2013-03-20 DIAGNOSIS — Z79899 Other long term (current) drug therapy: Secondary | ICD-10-CM | POA: Insufficient documentation

## 2013-03-20 DIAGNOSIS — R109 Unspecified abdominal pain: Secondary | ICD-10-CM | POA: Insufficient documentation

## 2013-03-20 DIAGNOSIS — I10 Essential (primary) hypertension: Secondary | ICD-10-CM | POA: Insufficient documentation

## 2013-03-20 DIAGNOSIS — IMO0001 Reserved for inherently not codable concepts without codable children: Secondary | ICD-10-CM | POA: Insufficient documentation

## 2013-03-20 DIAGNOSIS — E7800 Pure hypercholesterolemia, unspecified: Secondary | ICD-10-CM | POA: Insufficient documentation

## 2013-03-20 DIAGNOSIS — M791 Myalgia, unspecified site: Secondary | ICD-10-CM

## 2013-03-20 DIAGNOSIS — R110 Nausea: Secondary | ICD-10-CM | POA: Insufficient documentation

## 2013-03-20 LAB — CBC WITH DIFFERENTIAL/PLATELET
BASOS ABS: 0 10*3/uL (ref 0.0–0.1)
BASOS PCT: 1 % (ref 0–1)
Eosinophils Absolute: 0.1 10*3/uL (ref 0.0–0.7)
Eosinophils Relative: 2 % (ref 0–5)
HEMATOCRIT: 39.3 % (ref 36.0–46.0)
Hemoglobin: 12.8 g/dL (ref 12.0–15.0)
LYMPHS PCT: 22 % (ref 12–46)
Lymphs Abs: 1.5 10*3/uL (ref 0.7–4.0)
MCH: 27.4 pg (ref 26.0–34.0)
MCHC: 32.6 g/dL (ref 30.0–36.0)
MCV: 84 fL (ref 78.0–100.0)
MONO ABS: 0.4 10*3/uL (ref 0.1–1.0)
Monocytes Relative: 7 % (ref 3–12)
NEUTROS ABS: 4.6 10*3/uL (ref 1.7–7.7)
NEUTROS PCT: 69 % (ref 43–77)
PLATELETS: 259 10*3/uL (ref 150–400)
RBC: 4.68 MIL/uL (ref 3.87–5.11)
RDW: 13.6 % (ref 11.5–15.5)
WBC: 6.7 10*3/uL (ref 4.0–10.5)

## 2013-03-20 LAB — URINALYSIS, ROUTINE W REFLEX MICROSCOPIC
Bilirubin Urine: NEGATIVE
GLUCOSE, UA: NEGATIVE mg/dL
Hgb urine dipstick: NEGATIVE
Ketones, ur: NEGATIVE mg/dL
LEUKOCYTES UA: NEGATIVE
NITRITE: NEGATIVE
PROTEIN: NEGATIVE mg/dL
Specific Gravity, Urine: 1.02 (ref 1.005–1.030)
Urobilinogen, UA: 1 mg/dL (ref 0.0–1.0)
pH: 7.5 (ref 5.0–8.0)

## 2013-03-20 LAB — COMPREHENSIVE METABOLIC PANEL
ALBUMIN: 3.4 g/dL — AB (ref 3.5–5.2)
ALT: 16 U/L (ref 0–35)
AST: 15 U/L (ref 0–37)
Alkaline Phosphatase: 80 U/L (ref 39–117)
BILIRUBIN TOTAL: 0.3 mg/dL (ref 0.3–1.2)
BUN: 11 mg/dL (ref 6–23)
CHLORIDE: 96 meq/L (ref 96–112)
CO2: 27 meq/L (ref 19–32)
Calcium: 9.7 mg/dL (ref 8.4–10.5)
Creatinine, Ser: 0.56 mg/dL (ref 0.50–1.10)
GFR calc Af Amer: >90 mL/min (ref >90)
Glucose, Bld: 143 mg/dL — ABNORMAL HIGH (ref 70–99)
POTASSIUM: 3.9 meq/L (ref 3.7–5.3)
SODIUM: 137 meq/L (ref 137–147)
Total Protein: 8.2 g/dL (ref 6.0–8.3)

## 2013-03-20 MED ORDER — SODIUM CHLORIDE 0.9 % IV BOLUS (SEPSIS)
500.0000 mL | Freq: Once | INTRAVENOUS | Status: AC
  Administered 2013-03-20: 500 mL via INTRAVENOUS

## 2013-03-20 MED ORDER — METHOCARBAMOL 500 MG PO TABS: 500.0000 mg | ORAL_TABLET | Freq: Two times a day (BID) | ORAL | Status: DC

## 2013-03-20 MED ORDER — ACETAMINOPHEN ER 650 MG PO TBCR: 650.0000 mg | EXTENDED_RELEASE_TABLET | Freq: Three times a day (TID) | ORAL | Status: AC | PRN

## 2013-03-20 MED ORDER — IOHEXOL 300 MG/ML  SOLN
100.0000 mL | Freq: Once | INTRAMUSCULAR | Status: AC | PRN
  Administered 2013-03-20: 100 mL via INTRAVENOUS

## 2013-03-20 MED ORDER — IOHEXOL 300 MG/ML  SOLN
50.0000 mL | Freq: Once | INTRAMUSCULAR | Status: AC | PRN
  Administered 2013-03-20: 50 mL via ORAL

## 2013-03-20 MED ORDER — ONDANSETRON HCL 4 MG PO TABS: 4.0000 mg | ORAL_TABLET | Freq: Four times a day (QID) | ORAL | Status: DC

## 2013-03-20 MED ORDER — ONDANSETRON HCL 4 MG/2ML IJ SOLN
4.0000 mg | Freq: Once | INTRAMUSCULAR | Status: AC
  Administered 2013-03-20: 4 mg via INTRAVENOUS
  Filled 2013-03-20: qty 2

## 2013-03-20 MED ORDER — MORPHINE SULFATE 4 MG/ML IJ SOLN
4.0000 mg | Freq: Once | INTRAMUSCULAR | Status: AC
  Administered 2013-03-20: 4 mg via INTRAVENOUS
  Filled 2013-03-20: qty 1

## 2013-03-20 NOTE — Discharge Instructions (Signed)
Please call your doctor for a followup appointment within 24-48 hours. When you talk to your doctor please let them know that you were seen in the emergency department and have them acquire all of your records so that they can discuss the findings with you and formulate a treatment plan to fully care for your new and ongoing problems. 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 please follow-up with PCP regarding this and with cardiology On CT enlarged spleen noted please rest and avoid any physical activity - any injury to the left upper quadrant can lead to rupture to the spleen - please follow with primary doctor Please continue to monitor symptoms closely and if symptoms are to worsen or change (fever greater than 101, chills, sweating, chest pain, shortness of breath, difficulty breathing, blood in the urine, decreased urination, increased urination, changes to bowel movements, worsening or changes to pain) please report back to the ED immediately   Abdominal Pain, Adult Many things can cause abdominal pain. Usually, abdominal pain is not caused by a disease and will improve without treatment. It can often be observed and treated at home. Your health care provider will do a physical exam and possibly order blood tests and X-rays to help determine the seriousness of your pain. However, in many cases, more time must pass before a clear cause of the pain can be found. Before that point, your health care provider may not know if you need more testing or further treatment. HOME CARE INSTRUCTIONS  Monitor your abdominal pain for any changes. The following actions may help to alleviate any discomfort you are experiencing:  Only take over-the-counter or prescription medicines as directed by your health care provider.  Do not  take laxatives unless directed to do so by your health care provider.  Try a clear liquid diet (broth, tea, or water) as directed by your health care provider. Slowly move to a bland diet as tolerated. SEEK MEDICAL CARE IF:  You have unexplained abdominal pain.  You have abdominal pain associated with nausea or diarrhea.  You have pain when you urinate or have a bowel movement.  You experience abdominal pain that wakes you in the night.  You have abdominal pain that is worsened or improved by eating food.  You have abdominal pain that is worsened with eating fatty foods. SEEK IMMEDIATE MEDICAL CARE IF:   Your pain does not go away within 2 hours.  You have a fever.  You keep throwing up (vomiting).  Your pain is felt only in portions of the abdomen, such as the right side or the left lower portion of the abdomen.  You pass bloody or black tarry stools. MAKE SURE YOU:  Understand these instructions.   Will watch your condition.   Will get help right away if you are not doing well or get worse.  Document Released: 09/27/2004 Document Revised: 10/08/2012 Document Reviewed: 08/27/2012 [facility] Patient Information 2014 [place].   Emergency Department Resource Guide 1) Find a Doctor and Pay Out of Pocket Although you won't have to find out who is covered by your insurance plan, it is a good idea to ask around and get recommendations. You will then need to call the office and see if the doctor you have chosen will accept you as a new patient and what  types of options they offer for patients who are self-pay. Some doctors offer discounts or will set up payment plans for their patients who do not have insurance, but you will need to ask so you aren't surprised when you get to your appointment.  2) Contact Your Local Health Department Not all health departments have doctors that can see patients for sick visits, but many do, so it is worth a call to see if yours does. If  you don't know where your local health department is, you can check in your phone book. The CDC also has a tool to help you locate your state's health department, and many state websites also have listings of all of their local health departments.  3) Find a [facility] If your illness is not likely to be very severe or complicated, you may want to try a walk in clinic. These are popping up all over the country in pharmacies, drugstores, and shopping centers. They're usually staffed by nurse practitioners or physician assistants that have been trained to treat common illnesses and complaints. They're usually fairly quick and inexpensive. However, if you have serious medical issues or chronic medical problems, these are probably not your best option.  No Primary Care Doctor: - Call Health Connect at  [phone] - they can help you locate a primary care doctor that  accepts your insurance, provides certain services, etc. - Physician Referral Service- [phone]  Chronic Pain Problems: Organization         Address  Phone   Notes  [facility]  [phone] Patients need to be referred by their primary care doctor.   Medication Assistance: Organization         Address  Phone   Notes  [facility] Medication [facility] [address] [place], [place] [address] [phone] --Must be a resident of [facility] -- Must have NO insurance coverage whatsoever (no Medicaid/ Medicare, etc.) -- The pt. MUST have a primary care doctor that directs their care regularly and follows them in the community   MedAssist  [phone]   [employer]  [phone]    Agencies that provide inexpensive medical care: Organization         Address  Phone   Notes  [place]  [phone]   [facility]    [phone]   [facility] [place], [place] [address] [phone]   [place] [address] [place] [phone]   Planned Parenthood    [phone]   [facility]    [phone]   [place] and [place] [address], [place] Phone:  [phone], Fax:  [phone] Hours of Operation:  9 am - 6 pm, M-F.  Also accepts Medicaid/Medicare and self-pay.  [facility] for [address], [address], [place] Phone: [phone], Fax: [phone]. Hours of Operation:  8:30 am - 5:30 pm, M-F.  Also accepts Medicaid and self-pay.  [facility] [place] [address] [place] Phone: [phone]   [place], [place], [place] [phone], Ext. [phone] Mondays & Thursdays: 7-9 AM.  First 15 patients are seen on a first come, first serve basis.    [place] Providers:  Patent attorney  Notes  [facility]: Lambert Rhonda W [address], [place] [phone] Also accepts self-pay patients.  [facility] [address], [place]  [phone]   [place], [address], [place] [phone]   [facility] [address] [place] [phone]   Lucianne Lei [address], [place]   [phone] Only accepts [place] Access [place] patients after they have their name applied to their card.   Self-Pay (no insurance) in [facility]:  Organization         Address  Phone   Notes  Sickle Cell Patients, [facility] [place] [phone]   [facility] [place] [phone]   [facility] [place]  [place], [place], [place] [phone]   [facility]/Dr. Osei-Bonsu  [address], [place] or [address], [place] [phone]  Phone number for both [place] and [place] locations is the same.  [facility] [address] [place] [phone]   [facility] [address] [place] or [address] [phone]   [facility] [address], [place] [phone], phone; [phone], fax Sees patients 1st and 3rd Saturday of every month.  Must not qualify for public or private insurance (i.e. Medicaid, Medicare, [place] Health Choice, Veterans' Benefits)  Household income should be no more than 200% of the poverty level The clinic cannot treat you if you are pregnant or think you are pregnant  Sexually transmitted diseases are not treated at the clinic.    Dental Care: Organization         Address  Phone  Notes  [facility] Department of [facility] [place] [phone] Accepts children up to age 40 who are enrolled in [place] or [place]; pregnant women with a Medicaid card; and children who have applied for Medicaid or [place] Health Choice, but were declined, whose parents can pay a reduced fee at time of service.  [facility] Department of [facility]  [address], [place] [phone] Accepts children up to age 9 who are enrolled in [place] or [place]; pregnant women with a Medicaid card; and children who have applied for Medicaid or [place] Health Choice, but were declined, whose parents can pay a reduced fee at time of service.  [place] Adult [facility] Access PRO[facility]  [place] [phone] Patients are seen by appointment only. Walk-ins are not accepted. [place] will see patients 64 years of age and older. Monday - Tuesday (8am-5pm) Most Wednesdays (8:30-5pm) $30 per visit, cash only  [facility] Adult [facility] Access PRO[facility]  [address], [facility] [phone] Patients are seen by appointment  only. Walk-ins are not accepted. [place] will see patients 5 years of age and older. One Wednesday Evening (Monthly: Volunteer Based).  $30 per visit, cash only  [place]  [phone] for adults; Children under age 71, call Graduate Pediatric Dentistry at [phone]. Children aged 35-14, please call [phone] to request a pediatric application.  Dental services are provided in all areas of dental care including fillings, crowns and bridges, complete  and partial dentures, implants, gum treatment, root canals, and extractions. Preventive care is also provided. Treatment is provided to both adults and children. Patients are selected via a lottery and there is often a waiting list.   [facility] [address] [place]  [phone] [email or web address]   [facility] [address] [place], [place] [phone], Ext. [phone] Second and Fourth Thursday of each month, opens at 6:30 AM; Clinic ends at 9 AM.  Patients are seen on a first-come first-served basis, and a limited number are seen during each clinic.   [facility]  [address] Hillard Danker [place], [place] [phone]   Eligibility Requirements You must have lived in [place], [place], or [place] counties for at least the last three months.   You cannot be eligible for state or federal sponsored [employer], including [employer], [place], or [employer].   You generally cannot be eligible for healthcare insurance through your employer.    How to apply: Eligibility screenings are held every Tuesday and Wednesday afternoon from 1:00 pm until 4:00 pm. You do not need an appointment for the interview!  [facility] [address], [place], [place]   [place]  [place] Department  [place]  [phone]    Behavioral Health  Resources in the Community: Intensive Outpatient Programs Organization         Address  Phone  Notes  [place]. [address] [place], [place] [phone]   [facility] [address] [place], [place]   ADS: Alcohol & Drug Svcs [address] [place], [place]   [place] [address]  [place], [place] or [phone]   Substance Abuse Resources Organization         Address  Phone  Notes  Alcohol and Drug Services  [phone]   [place]  [phone]   [facility] [facility]   Chinita Pester  [phone]   Residential & Outpatient Substance Abuse Program  6415981240   Psychological Services Organization         Address  Phone  Notes  [facility] [place]  [place]  [phone]   [place] [address], [place] or [phone]    Mobile Crisis Teams Organization         Address  Phone  Notes  [facility] Alternatives, Mobile Crisis Care Unit  [phone]   [facility]otherapeutic [facility]  [address] [place], [place]   [place] [address], [place] [phone]    Self-Help/Support Groups Organization         Address  Phone             Notes  [place]. of [place] - variety of support groups  [place] Call for more information  Narcotics Anonymous (NA), [facility] [address], [employer] [place]  2 meetings at this location   Special educational needs teacher         Address  Phone  Notes  ASAP Residential Treatment [place],    [place]  [place]  [address] [place] [phone], [place], [place]   [place], [place] [phone] Admissions: 8am-3pm M-F  Incentives Substance [place] [address]    [place], [place] [phone]   [facility] [address] [place], [place], [place]   [facility] [address],  [place], [place]   [facility] - Intensive Outpatient [address] Kristeen [address], [place], [place]   [facility] ([place].) [place].,  [place], [place] [phone] or [phone]   [facility] Services (RTS) [address], [place], [place] Accepts Medicaid  [facility] [place] [address],  [place] [phone] Substance Abuse/Addiction Treatment   [employer] Organization         Address  Phone  Notes  [facility]  [phone]   Domenic Schwab, PhD [address] Arlis Porta [place], [place]   [phone] or [phone]   [facility] [place], [place] [phone]   [facility] [address], [place], [place] [phone] Insurance/Medicaid/sponsorship through [facility] and Families [address]                                    [place], [place] [phone] [place] [address], [place] [phone]    Dr. Adele Schilder  [phone]   [facility] Dept. 1) [address], [place] 2) [place] 3)  [place] [address], [place] [phone]  [phone]   [place] [phone] or [phone] (After Hours)

## 2013-03-20 NOTE — ED Provider Notes (Signed)
CSN: [id number]     Arrival date & time 03/20/13  1132 History  This chart was scribed for non-physician practitioner Jamse Mead, PA-C working with Wandra Arthurs, MD by Eston Mould, ED Scribe. This patient was seen in room WTR5/WTR5 and the patient's care was started at 12:08 PM .   Chief Complaint  Patient presents with  . Flank Pain   The history is provided by a relative. No language interpreter was used.   HPI Comments: Alison Beard is a 58 y.o. female who presents to the Emergency Department complaining of ongoing worsening L flank pain with nausea that began 3 days ago but has worsened today. She states the pain worsens with movement. Pt describes the pain as constant and sharp that does not radiate. Pt has previously been taking Ibuprofen with little relief. Pt was recently seen by at an Urgent Care on 03/09/2013/ and was prescribed Metrodiazenol due to vaginal infection. Pt states she had a pelvic sample taken that determined pt has an infection. She states she completed her medication 2 days ago.Pt denies any recent traumas, falls,  muscle injuries, and injuries.Pt denies dysuria, hematuria, fever, CP, abd pain, lower back pain, frequency, SOB, dyspnea, vaginal discharge, vaginal pain, emesis , diarrhea, hematochezia, tarry stools, and dizziness.  Past Medical History  Diagnosis Date  . Hypertension   . Hypercholesteremia    Past Surgical History  Procedure Laterality Date  . Dilation and curettage, diagnostic / therapeutic     History reviewed. No pertinent family history. History  Substance Use Topics  . Smoking status: Never Smoker   . Smokeless tobacco: Not on file  . Alcohol Use: No   OB History   Grav Para Term Preterm Abortions TAB SAB Ect Mult Living                 Review of Systems  Constitutional: Negative for fever.  Respiratory: Negative for shortness of breath.   Cardiovascular: Negative for chest pain.  Gastrointestinal: Positive for  nausea. Negative for vomiting, abdominal pain, diarrhea, blood in stool and rectal pain.  Genitourinary: Positive for flank pain (L side). Negative for dysuria, urgency, frequency, hematuria, decreased urine volume, vaginal bleeding, vaginal discharge, difficulty urinating, vaginal pain, menstrual problem and pelvic pain.  Musculoskeletal: Negative for back pain.  Neurological: Negative for dizziness.  All other systems reviewed and are negative.   Allergies  Review of patient's allergies indicates no known allergies.  Home Medications   Current Outpatient Rx  Name  Route  Sig  Dispense  Refill  . hydrochlorothiazide (HYDRODIURIL) 25 MG tablet   Oral   Take 25 mg by mouth daily.         Marland Kitchen ibuprofen (ADVIL,MOTRIN) 200 MG tablet   Oral   Take 400 mg by mouth every 6 (six) hours as needed for mild pain or moderate pain.         Marland Kitchen lovastatin (MEVACOR) 20 MG tablet   Oral   Take 20 mg by mouth at bedtime.         Marland Kitchen acetaminophen (TYLENOL 8 HOUR) 650 MG CR tablet   Oral   Take 1 tablet (650 mg total) by mouth every 8 (eight) hours as needed for pain.   11 tablet   0   . methocarbamol (ROBAXIN) 500 MG tablet   Oral   Take 1 tablet (500 mg total) by mouth 2 (two) times daily.   20 tablet   0   . metroNIDAZOLE (METROGEL) 0.75 %  vaginal gel   Vaginal   Place 1 Applicatorful vaginally at bedtime.         . ondansetron (ZOFRAN) 4 MG tablet   Oral   Take 1 tablet (4 mg total) by mouth every 6 (six) hours.   12 tablet   0    BP 125/69  Pulse 65  Temp(Src) 98.2 F (36.8 C) (Oral)  Resp 16  SpO2 96%  Physical Exam  Nursing note and vitals reviewed. Constitutional: She is oriented to person, place, and time. She appears well-developed and well-nourished. No distress.  HENT:  Head: Normocephalic and atraumatic.  Mouth/Throat: Oropharynx is clear and moist.  Eyes: Conjunctivae and EOM are normal. Pupils are equal, round, and reactive to light. Right eye exhibits no  discharge. Left eye exhibits no discharge.  Neck: Normal range of motion. Neck supple. No tracheal deviation present.  Cardiovascular: Normal rate, regular rhythm and normal heart sounds.  Exam reveals no friction rub.   No murmur heard. Pulses:      Radial pulses are 2+ on the right side, and 2+ on the left side.       Dorsalis pedis pulses are 2+ on the right side, and 2+ on the left side.  Pulmonary/Chest: Effort normal and breath sounds normal. No respiratory distress. She has no wheezes. She has no rales.  Abdominal: Soft. Bowel sounds are normal. There is no tenderness. There is no guarding.  Obese Negative bilateral CVA tenderness  Musculoskeletal: Normal range of motion.  Full ROM to upper and lower extremities without difficulty noted, negative ataxia noted.  Lymphadenopathy:    She has no cervical adenopathy.  Neurological: She is alert and oriented to person, place, and time. No cranial nerve deficit. She exhibits normal muscle tone. Coordination normal.  Skin: Skin is warm and dry. No rash noted. She is not diaphoretic. No erythema.  Psychiatric: She has a normal mood and affect. Her behavior is normal. Thought content normal.    ED Course  Procedures DIAGNOSTIC STUDIES: Oxygen Saturation is 99% on room air, normal by my interpretation.    COORDINATION OF CARE: 12:16 PM-Discussed treatment plan which includes UA, labs and CT scan. Pt agreed to plan.   3:17 PM This provider re-assessed patient. Patient doing well, denied pain. Discussed labs in great detail with sister and husband at bedside. Awaiting CT scan of abdomen and pelvis.  Results for orders placed during the hospital encounter of 03/20/13  URINALYSIS, ROUTINE W REFLEX MICROSCOPIC      Result Value Ref Range   Color, Urine YELLOW  YELLOW   APPearance CLEAR  CLEAR   Specific Gravity, Urine 1.020  1.005 - 1.030   pH 7.5  5.0 - 8.0   Glucose, UA NEGATIVE  NEGATIVE mg/dL   Hgb urine dipstick NEGATIVE  NEGATIVE    Bilirubin Urine NEGATIVE  NEGATIVE   Ketones, ur NEGATIVE  NEGATIVE mg/dL   Protein, ur NEGATIVE  NEGATIVE mg/dL   Urobilinogen, UA 1.0  0.0 - 1.0 mg/dL   Nitrite NEGATIVE  NEGATIVE   Leukocytes, UA NEGATIVE  NEGATIVE  CBC WITH DIFFERENTIAL      Result Value Ref Range   WBC 6.7  4.0 - 10.5 K/uL   RBC 4.68  3.87 - 5.11 MIL/uL   Hemoglobin 12.8  12.0 - 15.0 g/dL   HCT 39.3  36.0 - 46.0 %   MCV 84.0  78.0 - 100.0 fL   MCH 27.4  26.0 - 34.0 pg   MCHC 32.6  30.0 - 36.0 g/dL   RDW 13.6  11.5 - 15.5 %   Platelets 259  150 - 400 K/uL   Neutrophils Relative % 69  43 - 77 %   Neutro Abs 4.6  1.7 - 7.7 K/uL   Lymphocytes Relative 22  12 - 46 %   Lymphs Abs 1.5  0.7 - 4.0 K/uL   Monocytes Relative 7  3 - 12 %   Monocytes Absolute 0.4  0.1 - 1.0 K/uL   Eosinophils Relative 2  0 - 5 %   Eosinophils Absolute 0.1  0.0 - 0.7 K/uL   Basophils Relative 1  0 - 1 %   Basophils Absolute 0.0  0.0 - 0.1 K/uL  COMPREHENSIVE METABOLIC PANEL      Result Value Ref Range   Sodium 137  137 - 147 mEq/L   Potassium 3.9  3.7 - 5.3 mEq/L   Chloride 96  96 - 112 mEq/L   CO2 27  19 - 32 mEq/L   Glucose, Bld 143 (*) 70 - 99 mg/dL   BUN 11  6 - 23 mg/dL   Creatinine, Ser 0.56  0.50 - 1.10 mg/dL   Calcium 9.7  8.4 - 10.5 mg/dL   Total Protein 8.2  6.0 - 8.3 g/dL   Albumin 3.4 (*) 3.5 - 5.2 g/dL   AST 15  0 - 37 U/L   ALT 16  0 - 35 U/L   Alkaline Phosphatase 80  39 - 117 U/L   Total Bilirubin 0.3  0.3 - 1.2 mg/dL   GFR calc non Af Amer >90  >90 mL/min   GFR calc Af Amer >90  >90 mL/min     Labs Review Labs Reviewed  COMPREHENSIVE METABOLIC PANEL - Abnormal; Notable for the following:    Glucose, Bld 143 (*)    Albumin 3.4 (*)    All other components within normal limits  URINALYSIS, ROUTINE W REFLEX MICROSCOPIC  CBC WITH DIFFERENTIAL   Imaging Review Ct Abdomen Pelvis W Contrast  03/20/2013   CLINICAL DATA:  Ongoing left flank pain and nausea.  EXAM: CT ABDOMEN AND PELVIS WITH CONTRAST   TECHNIQUE: Multidetector CT imaging of the abdomen and pelvis was performed using the standard protocol following bolus administration of intravenous contrast.  CONTRAST:  130mL OMNIPAQUE IOHEXOL 300 MG/ML  SOLN  COMPARISON:  No priors.  FINDINGS: Lung Bases: Mild cardiomegaly. Calcifications of the aortic valve. Atherosclerotic calcifications in the left anterior descending, left circumflex and right coronary arteries. Small hiatal hernia.  Abdomen/Pelvis: The appearance of the liver, gallbladder, pancreas and bilateral adrenal glands is unremarkable. A few scattered sub cm low-attenuation lesions are noted in the kidneys bilaterally, too small to definitively characterize (statistically likely to represent tiny cysts). Spleen is enlarged measuring 8.4 x 16.3 x 7.8 cm (estimated splenic volume of 534 mL). In the anterior pole of the spleen there is a 8 mm well-circumscribed low-attenuation lesion which is incompletely characterized, but likely benign.  Numerous colonic diverticulae are noted, particularly in the region of the sigmoid colon, without surrounding inflammatory changes to suggest an acute diverticulitis at this time. No significant volume of ascites. No pneumoperitoneum. No pathologic distention of small bowel. No definite lymphadenopathy identified within the abdomen or pelvis. Uterus and ovaries are unremarkable in appearance. Urinary bladder is normal in appearance.  Musculoskeletal: There are no aggressive appearing lytic or blastic lesions noted in the visualized portions of the skeleton.  IMPRESSION: 1. No acute findings in the abdomen or  pelvis to account for the patient's symptoms. 2. Splenomegaly, as above. 3. Small hiatal hernia. 4. Atherosclerosis, including at least 3 vessel coronary artery disease. Please note that although the presence of coronary artery calcium documents the presence of coronary artery disease, the severity of this disease and any potential stenosis cannot be assessed on  this non-gated CT examination. Assessment for potential risk factor modification, dietary therapy or pharmacologic therapy may be warranted, if clinically indicated. 5. Additional incidental findings, as above.   Electronically Signed   By: Vinnie Langton M.D.   On: 03/20/2013 16:18     EKG Interpretation None     MDM   Final diagnoses:  Left flank pain  Muscular pain   Medications  sodium chloride 0.9 % bolus 500 mL (0 mLs Intravenous Stopped 03/20/13 1459)  morphine 4 MG/ML injection 4 mg (4 mg Intravenous Given 03/20/13 1359)  ondansetron (ZOFRAN) injection 4 mg (4 mg Intravenous Given 03/20/13 1359)  iohexol (OMNIPAQUE) 300 MG/ML solution 50 mL (50 mLs Oral Contrast Given 03/20/13 1422)  iohexol (OMNIPAQUE) 300 MG/ML solution 100 mL (100 mLs Intravenous Contrast Given 03/20/13 1537)   Filed Vitals:   03/20/13 1351 03/20/13 1400 03/20/13 1500 03/20/13 1600  BP: 132/73 115/72 139/82 125/69  Pulse: 64 66 61 65  Temp:      TempSrc:      Resp: 16  16 16   SpO2: 95%  100% 96%    I personally performed the services described in this documentation, which was scribed in my presence. The recorded information has been reviewed and is accurate.  Patient presenting to the ED with left flank pain that has been ongoing for the past 2-3 days with worsening today. Patient reported that the pain at first was with motion, but has now increased to constant pain described as a sharp pain localized to the left flank region without radiation. Stated that she has been using Ibuprofen with minimal relief. Patient reported that she was seen and assessed by her PCP on 03/09/2013 with pelvic discomfort where she was started on Flagyl, reported that she finished the medications approximately 2 days ago. Reported intermittent nausea. Denied fever, chills, chest pain, shortness of breath, difficulty breathing, hematuria, vaginal discharge, vaginal pain, abnormal vaginal bleeding, abdominal pain, vomiting, diarrhea,  melena, hematochezia, dizziness, dysuria. Alert and oriented. GCS 15. Heart rate and rhythm normal. Lungs clear to auscultation to upper and lower lobes bilaterally. Radial and DP pulses 2+ bilaterally. Bowel sounds are normal active in all 4 quadrants, soft upon palpation-negative pain upon palpation. Benign abdominal exam. Negative bilateral CVA tenderness noted. Strength intact to upper and lower extremities bilaterally with equal distribution. Equal grip strength.  CBC negative elevation white blood cell count-negative shift or leukocytosis. CMP negative findings noted -- kidneys and liver within normal limits. Urinalysis negative for infection-negative nitrites or leukocytosis. Negative hemoglobin noted in urine. CT abdomen and pelvis negative for acute abdominal or pelvic processes. Cytomegaly identified. Small hiatal hernia. Atherosclerosis including at least 3 vessel coronary artery disease. Doubt nephrolithiasis. Doubt hydronephrosis. Doubt appendicitis. Doubt pancreatitis. Doubt pyelonephritis. Negative acute abdominal processes identified. Incidental finding of splenomegaly and coronary artery disease three-vessel. [place] long discussion with patient and family at bedside regarding these findings - discussed with patient follow-up as an outpatient for monitoring these issues. Pain medications administered in ED setting. Patient stable, afebrile. Patient not septic appearing. Definitive etiology of left flank pain unknown. Suspicion to muscular in nature. Discharged patient. Discussed with patient to followup primary care  provider regarding coronary artery disease findings. Discussed with patient to rest and stay hydrated. Discussed with patient to closely monitor symptoms and if symptoms are to worsen or change to report back to the ED - strict return instructions given.  Patient agreed to plan of care, understood, all questions answered.   Jamse Mead, PA-C 03/20/13 2221

## 2013-03-20 NOTE — ED Notes (Signed)
Pt escorted to discharge window. Pt verbalized understanding discharge instructions. In no acute distress.  

## 2013-03-20 NOTE — ED Notes (Signed)
Pt alert and oriented x4. Respirations even and unlabored, bilateral symmetrical rise and fall of chest. Skin warm and dry. In no acute distress. Denies needs.   

## 2013-03-20 NOTE — ED Notes (Signed)
Pt states left flank pain starting 3 days ago.  No difficulty urinating. Unknown for fever.  Pt states does not recall any trauma or muscle injury

## 2013-03-22 NOTE — ED Provider Notes (Signed)
Medical screening examination/treatment/procedure(s) were performed by non-physician practitioner and as supervising physician I was immediately available for consultation/collaboration.   EKG Interpretation None        Mirna Mires, MD 03/22/13 [phone]

## 2013-04-10 ENCOUNTER — Encounter: Payer: Self-pay | Admitting: Women's Health

## 2013-04-10 ENCOUNTER — Ambulatory Visit (INDEPENDENT_AMBULATORY_CARE_PROVIDER_SITE_OTHER): Payer: 59 | Admitting: Women's Health

## 2013-04-10 VITALS — BP 136/88 | Ht 62.0 in | Wt 297.0 lb

## 2013-04-10 DIAGNOSIS — N950 Postmenopausal bleeding: Secondary | ICD-10-CM

## 2013-04-10 NOTE — Patient Instructions (Signed)
Postmenopausal Bleeding Postmenopausal bleeding is any bleeding a woman has after she has entered into menopause. Menopause is the end of a woman's fertile years. After menopause, a woman no longer ovulates or has menstrual periods.  Postmenopausal bleeding can be caused by various things. Any type of postmenopausal bleeding, even if it appears to be a typical menstrual period, is concerning. This should be evaluated by your health care provider. Any treatment will depend on the cause of the bleeding. HOME CARE INSTRUCTIONS Monitor your condition for any changes. The following actions may help to alleviate any discomfort you are experiencing:  Avoid the use of tampons and douches as directed by your health care provider.  Change your pads frequently.  Get regular pelvic exams and Pap tests.  Keep all follow-up appointments for diagnostic tests as directed by your health care provider. SEEK MEDICAL CARE IF:   Your bleeding lasts more than 1 week.  You have abdominal pain.  You have bleeding with sexual intercourse. SEEK IMMEDIATE MEDICAL CARE IF:   You have a fever, chills, headache, dizziness, muscle aches, and bleeding.  You have severe pain with bleeding.  You are passing blood clots.  You have bleeding and need more than 1 pad an hour.  You feel faint. MAKE SURE YOU:  Understand these instructions.  Will watch your condition.  Will get help right away if you are not doing well or get worse. Document Released: 03/28/2005 Document Revised: 10/08/2012 Document Reviewed: 07/17/2012 Exit[employer] Patient Information 2014 [employer].  

## 2013-04-10 NOTE — Progress Notes (Signed)
Patient ID: Alison Beard, female   DOB: 1955/11/17, 58 y.o.   MRN: [id number] Presents for problem evaluation of painless postmenopausal bleeding for 3-1/2 months on no HRT. Bleeding not daily, occasionally with heavy flow with clots. History of a D&C in 2010 for similar problem with benign findings. Referred from  primary care in [place] to evaluate postmenopausal bleeding. Originally from  [place], daughter interpreting. Treated for hypertension/hypercholesterolemia/asthma per primary care, recent annual exam. Reports normal Pap and mammogram history. MWF G5P5. Numerous labs at primary care normal H&H and TSH 01/2013.  Exam: Appears well. Obese. Abdomen soft nontender. External genitalia within normal limits. Speculum exam cervix without visible lesion, polyp or blood. Bimanual no CMT difficult exam due to abdominal  Obesity.  Postmenopausal bleeding Obesity Hypertension/hypercholesterolemia/asthma-primary care manages  Plan: Schedule sonohysterogram with biopsy with Dr. Toney Rakes. Will make plan after results.

## 2013-04-27 ENCOUNTER — Other Ambulatory Visit: Payer: Self-pay | Admitting: Gynecology

## 2013-04-27 DIAGNOSIS — N83339 Acquired atrophy of ovary and fallopian tube, unspecified side: Secondary | ICD-10-CM

## 2013-04-27 DIAGNOSIS — N950 Postmenopausal bleeding: Secondary | ICD-10-CM

## 2013-05-11 ENCOUNTER — Ambulatory Visit (INDEPENDENT_AMBULATORY_CARE_PROVIDER_SITE_OTHER): Payer: 59 | Admitting: Gynecology

## 2013-05-11 ENCOUNTER — Ambulatory Visit: Payer: 59 | Admitting: Gynecology

## 2013-05-11 ENCOUNTER — Other Ambulatory Visit: Payer: 59

## 2013-05-11 ENCOUNTER — Other Ambulatory Visit: Payer: Self-pay | Admitting: Gynecology

## 2013-05-11 ENCOUNTER — Ambulatory Visit (INDEPENDENT_AMBULATORY_CARE_PROVIDER_SITE_OTHER): Payer: 59

## 2013-05-11 DIAGNOSIS — N83339 Acquired atrophy of ovary and fallopian tube, unspecified side: Secondary | ICD-10-CM

## 2013-05-11 DIAGNOSIS — D251 Intramural leiomyoma of uterus: Secondary | ICD-10-CM

## 2013-05-11 DIAGNOSIS — N840 Polyp of corpus uteri: Secondary | ICD-10-CM

## 2013-05-11 DIAGNOSIS — R9389 Abnormal findings on diagnostic imaging of other specified body structures: Secondary | ICD-10-CM

## 2013-05-11 DIAGNOSIS — N950 Postmenopausal bleeding: Secondary | ICD-10-CM

## 2013-05-11 MED ORDER — MEGESTROL ACETATE 40 MG PO TABS: 40.0000 mg | ORAL_TABLET | Freq: Two times a day (BID) | ORAL | Status: DC

## 2013-05-11 NOTE — Progress Notes (Signed)
   The patient is a 58 year old who was seen as a new patient on April 10 by our nurse practitioner Elon Alas is a result of patient's postmenopausal bleeding that was described as having lasted for the past 3-1/2 months. Patient's daughter was present to translate since she is from [place]. Patient had similar situation back in 2009 whereby she had a D&C with normal pathology and bleeding stopped until now. Patient has history of hypertension and hypercholesterolemia as well as asthma and has been followed by Dr. Iona Beard Osei-Bonsu. Patient has informed me that she recently had a cardiology evaluation in [place] at her PCP here has a schedule for an echocardiogram on June 12 with followup with her PCP on June 23. Patient had a normal H&H and TSH in 2015. She is here for a sonohysterogram and endometrial biopsy.  Ultrasound: Uterus measures 11.9 x 7.1 x 6.0 cm with endometrial stripe of 13.4 mm. Endometrium was described as prominent and avascular. Right and left ovary were atrophic after the cervix was cleansed with Betadine solution a sterile catheter was inserted into the uterine cavity and normal saline was instilled. A right posterior uterine defect measuring 29 x 12 x 17 mm was noted.  Following this a sterile Pipelle was introduced into the uterine cavity and an endometrial biopsy was obtained and tissue submitted for histological evaluation.  Assessment/plan: Postmenopausal bleeding etiology appears to be endometrial polyp as seen on sonohysterogram today. Patient will be placed on Megace 40 mg twice a day for 10 days with 2 refills. We will obtain medical clearance from her PCP and await cardiology evaluation. Information on resectoscopic polypectomy was provided. We will need to see the patient the week prior to her surgery for preoperative examination.

## 2013-05-11 NOTE — Addendum Note (Signed)
Addended by: Terrance Mass on: 05/11/2013 12:48 PM   Modules accepted: Orders

## 2013-06-22 ENCOUNTER — Encounter: Payer: Self-pay | Admitting: Gynecology

## 2013-06-22 ENCOUNTER — Encounter (HOSPITAL_COMMUNITY): Payer: Self-pay | Admitting: Pharmacist

## 2013-06-22 ENCOUNTER — Other Ambulatory Visit (HOSPITAL_COMMUNITY)
Admission: RE | Admit: 2013-06-22 | Discharge: 2013-06-22 | Disposition: A | Discharge status: Home / Self Care | Payer: 59 | Source: Ambulatory Visit | Attending: Gynecology | Admitting: Gynecology

## 2013-06-22 ENCOUNTER — Ambulatory Visit (INDEPENDENT_AMBULATORY_CARE_PROVIDER_SITE_OTHER): Payer: 59 | Admitting: Gynecology

## 2013-06-22 VITALS — BP 130/86

## 2013-06-22 DIAGNOSIS — N840 Polyp of corpus uteri: Secondary | ICD-10-CM

## 2013-06-22 DIAGNOSIS — Z124 Encounter for screening for malignant neoplasm of cervix: Secondary | ICD-10-CM

## 2013-06-22 DIAGNOSIS — Z01818 Encounter for other preprocedural examination: Secondary | ICD-10-CM

## 2013-06-22 DIAGNOSIS — N950 Postmenopausal bleeding: Secondary | ICD-10-CM

## 2013-06-22 DIAGNOSIS — Z01419 Encounter for gynecological examination (general) (routine) without abnormal findings: Secondary | ICD-10-CM | POA: Insufficient documentation

## 2013-06-22 DIAGNOSIS — Z1151 Encounter for screening for human papillomavirus (HPV): Secondary | ICD-10-CM | POA: Insufficient documentation

## 2013-06-22 MED ORDER — METOCLOPRAMIDE HCL 10 MG PO TABS: 10.0000 mg | ORAL_TABLET | Freq: Three times a day (TID) | ORAL | Status: AC

## 2013-06-22 NOTE — Addendum Note (Signed)
Addended by: Thurnell Garbe A on: 06/22/2013 11:31 AM   Modules accepted: Orders

## 2013-06-22 NOTE — Progress Notes (Signed)
Alison Beard is an 58 y.o. female. For preop consultatuin who was seen as a new patient on April 10 by our nurse practitioner Elon Alas is a result of patient's postmenopausal bleeding that was described as having lasted for the past 3-1/2 months. Patient's daughter was present to translate since she is from [place]. Patient had similar situation back in 2009 whereby she had a D&C with normal pathology and bleeding stopped until now. Patient has history of hypertension and hypercholesterolemia as well as asthma and has been followed by Dr. Iona Beard Osei-Bonsu. Patient has informed me that she recently had a cardiology evaluation in [place] at her PCP here has a schedule for an echocardiogram on June 12 with followup with her PCP on June 23. Patient had a normal H&H and TSH in 2015. She is here for a sonohysterogram and endometrial biopsy.  Ultrasound  and sonohysterogram 05/11/2013:  Uterus measures 11.9 x 7.1 x 6.0 cm with endometrial stripe of 13.4 mm. Endometrium was described as prominent and avascular. Right and left ovary were atrophic after the cervix was cleansed with Betadine solution a sterile catheter was inserted into the uterine cavity and normal saline was instilled. A right posterior uterine defect measuring 29 x 12 x 17 mm was noted.  Patient was started on Megace 40 mg BID afer endometrial biopsy. Endometrial biopsy same day pathology report: Endometrium, biopsy - BENIGN ENDOMETRIOID-TYPE POLYP(S). - DEGENERATING SECRETORY-TYPE ENDOMETRIUM - BENIGN ENDOCERVICAL-TYPE MUCOSA. - THERE IS NO EVIDENCE OF HYPERPLASIA OR MALIGNANCY. - SEE COMMENT. Microscopic Comment    Pertinent Gynecological History: Menses: post-menopausal Bleeding: post menopausal bleeding Contraception: post menopausal status DES exposure: unknown Blood transfusions: none Sexually transmitted diseases: no past history Previous GYN Procedures: 5 NSVD  Last mammogram: normal Date: 2009 Last pap: normal  Date: 2007 OB History: G5, P5   Menstrual History: Menarche age: 65  No LMP recorded. Patient is postmenopausal.    Past Medical History  Diagnosis Date  . Hypertension   . Hypercholesteremia     Past Surgical History  Procedure Laterality Date  . Dilation and curettage, diagnostic / therapeutic      Family History  Problem Relation Age of Onset  . Breast cancer Mother 75  . Cancer Father     Stomach  . Heart disease Brother     Social History:  reports that she has never smoked. She does not have any smokeless tobacco history on file. She reports that she does not drink alcohol or use illicit drugs.  Allergies: No Known Allergies   (Not in a hospital admission)  REVIEW OF SYSTEMS: A ROS was performed and pertinent positives and negatives are included in the history.  GENERAL: No fevers or chills. HEENT: No change in vision, no earache, sore throat or sinus congestion. NECK: No pain or stiffness. CARDIOVASCULAR: No chest pain or pressure. No palpitations. PULMONARY: No shortness of breath, cough or wheeze. GASTROINTESTINAL: No abdominal pain, nausea, vomiting or diarrhea, melena or bright red blood per rectum. GENITOURINARY: No urinary frequency, urgency, hesitancy or dysuria. MUSCULOSKELETAL: No joint or muscle pain, no back pain, no recent trauma. DERMATOLOGIC: No rash, no itching, no lesions. ENDOCRINE: No polyuria, polydipsia, no heat or cold intolerance. No recent change in weight. HEMATOLOGICAL: post menopausal bleeding. NEUROLOGIC: No headache, seizures, numbness, tingling or weakness. PSYCHIATRIC: No depression, no loss of interest in normal activity or change in sleep pattern.     Blood pressure 130/86.  Physical Exam:  HEENT:unremarkable Neck:Supple, midline, no thyroid megaly, no carotid  bruits Lungs:  Clear to auscultation no rhonchi's or wheezes Heart:Regular rate and rhythm, no murmurs or gallops Breast Exam: Symmetrical in appearance no palpable mass or  tenderness the supra-auricular actually lymphadenopathy Abdomen: Pendulous soft nontender no rebound or guarding Pelvic:BUS within normal limits Vagina: Dark blood noted Cervix: No lesions seen Uterus: Limited due to abdominal girth C. ultrasound report above Adnexa: Same as above Extremities: No cords, no edema Rectal: Not done   Assessment/Plan: Patient scheduled for resectoscopic polypectomy. She was on Megace 40 mg twice a day to help with her bleeding. Recent endometrial biopsy was benign with endometrioid polypoid lesion reported. The risk of surgery were discussed as follows:                        Patient was counseled as to the risk of surgery to include the following:  1. Infection (prohylactic antibiotics will be administered)  2. DVT/Pulmonary Embolism (prophylactic pneumo compression stockings will be used)  3.Trauma to internal organs requiring additional surgical procedure to repair any injury to     Internal organs requiring perhaps additional hospitalization days.  4.Hemmorhage requiring transfusion and blood products which carry risks such as  anaphylactic reaction, hepatitis and AIDS  Patient had received literature information on the procedure scheduled and all her questions were answered and fully accepts all risk.  We had requested medical clearance from her PCP Dr. Iona Beard Osei-Bonsu.    [facility] VQX45:03 AMTD@Note : This dictation was prepared with  Dragon/digital dictation along withSmart phrase technology. Any transcriptional errors that result from this process are unintentional.      Terrance Mass 06/22/2013, 11:05 AM  Note: This dictation was prepared with  Dragon/digital dictation along withSmart phrase technology. Any transcriptional errors that result from this process are unintentional.

## 2013-06-22 NOTE — Patient Instructions (Addendum)
   Your procedure is scheduled on: Thursday, June 25  Enter through the [employer] [facility] at: 6 AM Pick up the phone at the desk and dial [phone] and inform us of your arrival.  Please call this number if you have any problems the morning of surgery: [phone]  Remember: Do not eat or drink after midnight: Wednesday Take these medicines the morning of surgery with a SIP OF WATER: none (patient takes medications at night)  Do not wear jewelry, make-up, or FINGER nail polish No metal in your hair or on your body. Do not wear lotions, powders, perfumes.  You may wear deodorant.  Do not bring valuables to the hospital. Contacts, dentures or bridgework may not be worn into surgery.  Patients discharged on the day of surgery will not be allowed to drive home.  Home with daughter Alison Beard cell [phone].

## 2013-06-23 ENCOUNTER — Encounter (HOSPITAL_COMMUNITY)
Admission: RE | Admit: 2013-06-23 | Discharge: 2013-06-23 | Disposition: A | Discharge status: Home / Self Care | Payer: 59 | Source: Ambulatory Visit | Attending: Gynecology | Admitting: Gynecology

## 2013-06-23 ENCOUNTER — Encounter (HOSPITAL_COMMUNITY): Payer: Self-pay

## 2013-06-23 ENCOUNTER — Other Ambulatory Visit: Payer: Self-pay

## 2013-06-23 HISTORY — DX: Unspecified asthma, uncomplicated: J45.909

## 2013-06-23 LAB — URINALYSIS, ROUTINE W REFLEX MICROSCOPIC
BILIRUBIN URINE: NEGATIVE
Glucose, UA: NEGATIVE mg/dL
Ketones, ur: NEGATIVE mg/dL
Nitrite: NEGATIVE
PROTEIN: 30 mg/dL — AB
Specific Gravity, Urine: 1.025 (ref 1.005–1.030)
Urobilinogen, UA: 0.2 mg/dL (ref 0.0–1.0)
pH: 6 (ref 5.0–8.0)

## 2013-06-23 LAB — CBC
HCT: 39.7 % (ref 36.0–46.0)
HEMOGLOBIN: 13.1 g/dL (ref 12.0–15.0)
MCH: 28.1 pg (ref 26.0–34.0)
MCHC: 33 g/dL (ref 30.0–36.0)
MCV: 85.2 fL (ref 78.0–100.0)
Platelets: 241 10*3/uL (ref 150–400)
RBC: 4.66 MIL/uL (ref 3.87–5.11)
RDW: 13.7 % (ref 11.5–15.5)
WBC: 8.6 10*3/uL (ref 4.0–10.5)

## 2013-06-23 LAB — BASIC METABOLIC PANEL
BUN: 14 mg/dL (ref 6–23)
CO2: 25 meq/L (ref 19–32)
Calcium: 9.5 mg/dL (ref 8.4–10.5)
Chloride: 100 mEq/L (ref 96–112)
Creatinine, Ser: 0.54 mg/dL (ref 0.50–1.10)
GFR calc Af Amer: >90 mL/min (ref >90)
GLUCOSE: 100 mg/dL — AB (ref 70–99)
POTASSIUM: 3.5 meq/L — AB (ref 3.7–5.3)
Sodium: 138 mEq/L (ref 137–147)

## 2013-06-23 LAB — CYTOLOGY - PAP

## 2013-06-23 LAB — URINE MICROSCOPIC-ADD ON

## 2013-06-23 NOTE — Pre-Procedure Instructions (Signed)
Fish farm manager, Alison Beard used during [employer].  Daughter Alison Beard is also present during PAT.

## 2013-06-24 MED ORDER — DEXTROSE 5 % IV SOLN
2.0000 g | INTRAVENOUS | Status: DC
  Filled 2013-06-24: qty 2

## 2013-06-25 ENCOUNTER — Encounter (HOSPITAL_COMMUNITY)
Admission: RE | Disposition: A | Discharge status: Home / Self Care | Payer: Self-pay | Source: Ambulatory Visit | Attending: Gynecology

## 2013-06-25 ENCOUNTER — Ambulatory Visit (HOSPITAL_COMMUNITY): Payer: 59 | Admitting: Anesthesiology

## 2013-06-25 ENCOUNTER — Ambulatory Visit (HOSPITAL_COMMUNITY)
Admission: RE | Admit: 2013-06-25 | Discharge: 2013-06-25 | Disposition: A | Discharge status: Home / Self Care | Payer: 59 | Source: Ambulatory Visit | Attending: Gynecology | Admitting: Gynecology

## 2013-06-25 ENCOUNTER — Encounter (HOSPITAL_COMMUNITY): Payer: 59 | Admitting: Anesthesiology

## 2013-06-25 DIAGNOSIS — I10 Essential (primary) hypertension: Secondary | ICD-10-CM | POA: Insufficient documentation

## 2013-06-25 DIAGNOSIS — N840 Polyp of corpus uteri: Secondary | ICD-10-CM

## 2013-06-25 DIAGNOSIS — N950 Postmenopausal bleeding: Secondary | ICD-10-CM | POA: Insufficient documentation

## 2013-06-25 DIAGNOSIS — Z9889 Other specified postprocedural states: Secondary | ICD-10-CM

## 2013-06-25 DIAGNOSIS — J45909 Unspecified asthma, uncomplicated: Secondary | ICD-10-CM | POA: Insufficient documentation

## 2013-06-25 DIAGNOSIS — Z803 Family history of malignant neoplasm of breast: Secondary | ICD-10-CM | POA: Insufficient documentation

## 2013-06-25 DIAGNOSIS — I252 Old myocardial infarction: Secondary | ICD-10-CM | POA: Insufficient documentation

## 2013-06-25 DIAGNOSIS — Z6841 Body Mass Index (BMI) 40.0 and over, adult: Secondary | ICD-10-CM | POA: Insufficient documentation

## 2013-06-25 DIAGNOSIS — E7800 Pure hypercholesterolemia, unspecified: Secondary | ICD-10-CM | POA: Insufficient documentation

## 2013-06-25 SURGERY — DILATATION & CURETTAGE/HYSTEROSCOPY WITH MYOSURE
Anesthesia: General | Site: Vagina

## 2013-06-25 MED ORDER — SODIUM CHLORIDE 0.9 % IJ SOLN
INTRAMUSCULAR | Status: DC | PRN
  Administered 2013-06-25: 10 mL via INTRAVENOUS

## 2013-06-25 MED ORDER — LIDOCAINE HCL (CARDIAC) 20 MG/ML IV SOLN
INTRAVENOUS | Status: AC
  Filled 2013-06-25: qty 5

## 2013-06-25 MED ORDER — ACETAMINOPHEN 160 MG/5ML PO SOLN: 325.0000 mg | ORAL | Status: DC | PRN

## 2013-06-25 MED ORDER — PROPOFOL 10 MG/ML IV EMUL
INTRAVENOUS | Status: AC
  Filled 2013-06-25: qty 20

## 2013-06-25 MED ORDER — VASOPRESSIN 20 UNIT/ML IJ SOLN
INTRAMUSCULAR | Status: DC | PRN
  Administered 2013-06-25: 20 [IU]

## 2013-06-25 MED ORDER — PROMETHAZINE HCL 25 MG/ML IJ SOLN: 6.2500 mg | INTRAMUSCULAR | Status: DC | PRN

## 2013-06-25 MED ORDER — MIDAZOLAM HCL 2 MG/2ML IJ SOLN
INTRAMUSCULAR | Status: AC
  Filled 2013-06-25: qty 2

## 2013-06-25 MED ORDER — PROPOFOL 10 MG/ML IV BOLUS
INTRAVENOUS | Status: DC | PRN
  Administered 2013-06-25: 200 mg via INTRAVENOUS

## 2013-06-25 MED ORDER — SODIUM CHLORIDE 0.9 % IJ SOLN
INTRAMUSCULAR | Status: AC
  Filled 2013-06-25: qty 50

## 2013-06-25 MED ORDER — ONDANSETRON HCL 4 MG/2ML IJ SOLN
INTRAMUSCULAR | Status: AC
  Filled 2013-06-25: qty 2

## 2013-06-25 MED ORDER — FENTANYL CITRATE 0.05 MG/ML IJ SOLN
INTRAMUSCULAR | Status: AC
  Filled 2013-06-25: qty 2

## 2013-06-25 MED ORDER — MIDAZOLAM HCL 2 MG/2ML IJ SOLN
INTRAMUSCULAR | Status: DC | PRN
  Administered 2013-06-25: 1 mg via INTRAVENOUS

## 2013-06-25 MED ORDER — ACETAMINOPHEN 325 MG PO TABS: 325.0000 mg | ORAL_TABLET | ORAL | Status: DC | PRN

## 2013-06-25 MED ORDER — FENTANYL CITRATE 0.05 MG/ML IJ SOLN
INTRAMUSCULAR | Status: DC | PRN
  Administered 2013-06-25: 100 ug via INTRAVENOUS
  Administered 2013-06-25: 25 ug via INTRAVENOUS

## 2013-06-25 MED ORDER — ONDANSETRON HCL 4 MG/2ML IJ SOLN
INTRAMUSCULAR | Status: DC | PRN
  Administered 2013-06-25: 4 mg via INTRAVENOUS

## 2013-06-25 MED ORDER — KETOROLAC TROMETHAMINE 30 MG/ML IJ SOLN
INTRAMUSCULAR | Status: DC | PRN
  Administered 2013-06-25: 30 mg via INTRAVENOUS

## 2013-06-25 MED ORDER — SODIUM CHLORIDE 0.9 % IR SOLN
Status: DC | PRN
  Administered 2013-06-25: 3000 mL

## 2013-06-25 MED ORDER — VASOPRESSIN 20 UNIT/ML IJ SOLN
INTRAMUSCULAR | Status: AC
  Filled 2013-06-25: qty 1

## 2013-06-25 MED ORDER — OXYCODONE HCL 5 MG PO TABS: 5.0000 mg | ORAL_TABLET | Freq: Once | ORAL | Status: DC | PRN

## 2013-06-25 MED ORDER — LACTATED RINGERS IV SOLN
INTRAVENOUS | Status: DC
  Administered 2013-06-25 (×3): via INTRAVENOUS

## 2013-06-25 MED ORDER — LIDOCAINE HCL (CARDIAC) 20 MG/ML IV SOLN
INTRAVENOUS | Status: DC | PRN
  Administered 2013-06-25: 80 mg via INTRAVENOUS

## 2013-06-25 MED ORDER — FENTANYL CITRATE 0.05 MG/ML IJ SOLN: 25.0000 ug | INTRAMUSCULAR | Status: DC | PRN

## 2013-06-25 MED ORDER — OXYCODONE HCL 5 MG/5ML PO SOLN: 5.0000 mg | Freq: Once | ORAL | Status: DC | PRN

## 2013-06-25 MED ORDER — KETOROLAC TROMETHAMINE 30 MG/ML IJ SOLN
INTRAMUSCULAR | Status: AC
  Filled 2013-06-25: qty 1

## 2013-06-25 SURGICAL SUPPLY — 29 items
CANISTERS HI-FLOW 3000CC (CANNISTER) ×3 IMPLANT
CATH FOLEY 2WAY SLVR 30CC 16FR (CATHETERS) IMPLANT
CATH ROBINSON RED A/P 16FR (CATHETERS) ×2 IMPLANT
CLOTH BEACON ORANGE TIMEOUT ST (SAFETY) ×2 IMPLANT
CONTAINER PREFILL 10% NBF 60ML (FORM) ×2 IMPLANT
DEVICE MYOSURE CLASSIC (MISCELLANEOUS) ×2 IMPLANT
DEVICE MYOSURE LITE (MISCELLANEOUS) IMPLANT
DRAPE HYSTEROSCOPY (DRAPE) ×2 IMPLANT
ELECTRODE ROLLER VERSAPOINT (ELECTRODE) IMPLANT
ELECTRODE RT ANGLE VERSAPOINT (CUTTING LOOP) IMPLANT
FILTER ARTHROSCOPY CONVERTOR (FILTER) ×2 IMPLANT
GLOVE BIOGEL PI IND STRL 8 (GLOVE) ×1 IMPLANT
GLOVE BIOGEL PI INDICATOR 8 (GLOVE) ×1
GLOVE ECLIPSE 7.5 STRL STRAW (GLOVE) ×4 IMPLANT
GOWN STRL REUS W/TWL LRG LVL3 (GOWN DISPOSABLE) ×4 IMPLANT
LOOP ANGLED CUTTING 22FR (CUTTING LOOP) IMPLANT
PACK VAGINAL MINOR WOMEN LF (CUSTOM PROCEDURE TRAY) ×2 IMPLANT
PAD OB MATERNITY 4.3X12.25 (PERSONAL CARE ITEMS) ×2 IMPLANT
PAD PREP 24X48 CUFFED NSTRL (MISCELLANEOUS) ×2 IMPLANT
PLUG CATH AND CAP STER (CATHETERS) IMPLANT
SEAL ROD LENS SCOPE MYOSURE (ABLATOR) ×2 IMPLANT
SET BERKELEY SUCTION TUBING (SUCTIONS) ×1 IMPLANT
SET TUBING HYSTEROSCOPY 2 NDL (TUBING) ×2 IMPLANT
SYR 30ML LL (SYRINGE) IMPLANT
TOWEL OR 17X24 6PK STRL BLUE (TOWEL DISPOSABLE) ×4 IMPLANT
TUBE HYSTEROSCOPY W Y-CONNECT (TUBING) IMPLANT
VACURETTE 8 RIGID CVD (CANNULA) ×2 IMPLANT
VACURETTE 9 RIGID CVD (CANNULA) IMPLANT
WATER STERILE IRR 1000ML POUR (IV SOLUTION) IMPLANT

## 2013-06-25 NOTE — Discharge Instructions (Signed)
DISCHARGE INSTRUCTIONS: D&C  The following instructions have been prepared to help you care for yourself upon your return home.  MAY TAKE IBUPROFEN (MOTRIN, ADVIL) OR ALEVE AFTER 2:15 PM FOR PAIN!!!!   Personal hygiene:  Use sanitary pads for vaginal drainage, not tampons.  Shower the day after your procedure.  NO tub baths, pools or Jacuzzis for 2-3 weeks.  Wipe front to back after using the bathroom.  Activity and limitations:  Do NOT drive or operate any equipment for 24 hours. The effects of anesthesia are still present and drowsiness may result.  Do NOT rest in bed all day.  Walking is encouraged.  Walk up and down stairs slowly.  You may resume your normal activity in one to two days or as indicated by your physician.  Sexual activity: NO intercourse for at least 2 weeks after the procedure, or as indicated by your physician.  Diet: Eat a light meal as desired this evening. You may resume your usual diet tomorrow.  Return to work: You may resume your work activities in one to two days or as indicated by your doctor.  What to expect after your surgery: Expect to have vaginal bleeding/discharge for 2-3 days and spotting for up to 10 days. It is not unusual to have soreness for up to 1-2 weeks. You may have a slight burning sensation when you urinate for the first day. Mild cramps may continue for a couple of days. You may have a regular period in 2-6 weeks.  Call your doctor for any of the following:  Excessive vaginal bleeding, saturating and changing one pad every hour.  Inability to urinate 6 hours after discharge from hospital.  Pain not relieved by pain medication.  Fever of 100.4 F or greater.  Unusual vaginal discharge or odor.   Call for an appointment:    Patients signature: ______________________  Nurses signature ________________________  Support person's signature_______________________

## 2013-06-25 NOTE — Anesthesia Preprocedure Evaluation (Addendum)
Anesthesia Evaluation  Patient identified by MRN, date of birth, ID band Patient awake    Reviewed: Allergy & Precautions, H&P , NPO status , Patient's Chart, lab work & pertinent test results  History of Anesthesia Complications Negative for: history of anesthetic complications  Airway Mallampati: III TM Distance: <3 FB Neck ROM: Full    Dental  (+) Teeth Intact,    Pulmonary neg shortness of breath, asthma , neg sleep apnea, neg recent URI,  breath sounds clear to auscultation- rhonchi        Cardiovascular hypertension, Pt. on medications - angina- Past MI - dysrhythmias Rhythm:Regular     Neuro/Psych negative neurological ROS  negative psych ROS   GI/Hepatic negative GI ROS, Neg liver ROS,   Endo/Other  Morbid obesity  Renal/GU negative Renal ROS     Musculoskeletal   Abdominal   Peds  Hematology negative hematology ROS (+)   Anesthesia Other Findings   Reproductive/Obstetrics                          Anesthesia Physical Anesthesia Plan  ASA: II  Anesthesia Plan: General   Post-op Pain Management:    Induction: Intravenous  Airway Management Planned: LMA  Additional Equipment: None  Intra-op Plan:   Post-operative Plan: Extubation in OR  Informed Consent: I have reviewed the patients History and Physical, chart, labs and discussed the procedure including the risks, benefits and alternatives for the proposed anesthesia with the patient or authorized representative who has indicated his/her understanding and acceptance.   Dental advisory given  Plan Discussed with: CRNA and Surgeon  Anesthesia Plan Comments:         Anesthesia Quick Evaluation

## 2013-06-25 NOTE — Anesthesia Postprocedure Evaluation (Signed)
Anesthesia Post Note  Patient: Alison Beard  Procedure(s) Performed: Procedure(s) (LRB): DILATATION & CURETTAGE/HYSTEROSCOPY WITH MYOSURE ABLATION (N/A)  Anesthesia type: GA  Patient location: PACU  Post pain: Pain level controlled  Post assessment: Post-op Vital signs reviewed  Last Vitals:  Filed Vitals:   06/25/13 0930  BP: 154/76  Pulse: 74  Temp: 36.4 C  Resp: 23    Post vital signs: Reviewed  Level of consciousness: sedated  Complications: No apparent anesthesia complications

## 2013-06-25 NOTE — Interval H&P Note (Signed)
History and Physical Interval Note:  06/25/2013 7:07 AM  Alison Beard  has presented today for surgery, with the diagnosis of endometrial polyp  The various methods of treatment have been discussed with the patient and family. After consideration of risks, benefits and other options for treatment, the patient has consented to  Procedure(s) with comments: [place] (N/A) - with Myosure. Rep to be present. as a surgical intervention .  The patient's history has been reviewed, patient examined, no change in status, stable for surgery.  I have reviewed the patient's chart and labs.  Questions were answered to the patient's satisfaction.     Terrance Mass

## 2013-06-25 NOTE — Transfer of Care (Signed)
Immediate Anesthesia Transfer of Care Note  Patient: Alison Beard  Procedure(s) Performed: Procedure(s) with comments: DILATATION & CURETTAGE/HYSTEROSCOPY WITH MYOSURE ABLATION (N/A) - with Myosure. Rep to be present.  Patient Location: PACU  Anesthesia Type:General  Level of Consciousness: awake, alert  and oriented  Airway & Oxygen Therapy: Patient Spontanous Breathing and Patient connected to nasal cannula oxygen  Post-op Assessment: Report given to PACU RN and Post -op Vital signs reviewed and stable  Post vital signs: Reviewed and stable  Complications: No apparent anesthesia complications

## 2013-06-25 NOTE — Anesthesia Procedure Notes (Signed)
Procedure Name: LMA Insertion Date/Time: 06/25/2013 7:29 AM Performed by: STERLING, Sheron Nightingale Pre-anesthesia Checklist: Patient identified, Patient being monitored, Emergency Drugs available, Timeout performed and Suction available Patient Re-evaluated:Patient Re-evaluated prior to inductionOxygen Delivery Method: Circle system utilized Preoxygenation: Pre-oxygenation with 100% oxygen Intubation Type: IV induction LMA: LMA inserted LMA Size: 4.0 Number of attempts: 1 ETT to lip (cm): AT LIP. Dental Injury: Teeth and Oropharynx as per pre-operative assessment

## 2013-06-25 NOTE — H&P (View-Only) (Signed)
Alison Beard is an 58 y.o. female. For preop consultatuin who was seen as a new patient on April 10 by our nurse practitioner Elon Alas is a result of patient's postmenopausal bleeding that was described as having lasted for the past 3-1/2 months. Patient's daughter was present to translate since she is from [place]. Patient had similar situation back in 2009 whereby she had a D&C with normal pathology and bleeding stopped until now. Patient has history of hypertension and hypercholesterolemia as well as asthma and has been followed by Dr. Iona Beard Osei-Bonsu. Patient has informed me that she recently had a cardiology evaluation in [place] at her PCP here has a schedule for an echocardiogram on June 12 with followup with her PCP on June 23. Patient had a normal H&H and TSH in 2015. She is here for a sonohysterogram and endometrial biopsy.  Ultrasound  and sonohysterogram 05/11/2013:  Uterus measures 11.9 x 7.1 x 6.0 cm with endometrial stripe of 13.4 mm. Endometrium was described as prominent and avascular. Right and left ovary were atrophic after the cervix was cleansed with Betadine solution a sterile catheter was inserted into the uterine cavity and normal saline was instilled. A right posterior uterine defect measuring 29 x 12 x 17 mm was noted.  Patient was started on Megace 40 mg BID afer endometrial biopsy. Endometrial biopsy same day pathology report: Endometrium, biopsy - BENIGN ENDOMETRIOID-TYPE POLYP(S). - DEGENERATING SECRETORY-TYPE ENDOMETRIUM - BENIGN ENDOCERVICAL-TYPE MUCOSA. - THERE IS NO EVIDENCE OF HYPERPLASIA OR MALIGNANCY. - SEE COMMENT. Microscopic Comment    Pertinent Gynecological History: Menses: post-menopausal Bleeding: post menopausal bleeding Contraception: post menopausal status DES exposure: unknown Blood transfusions: none Sexually transmitted diseases: no past history Previous GYN Procedures: 5 NSVD  Last mammogram: normal Date: 2009 Last pap: normal  Date: 2007 OB History: G5, P5   Menstrual History: Menarche age: 30  No LMP recorded. Patient is postmenopausal.    Past Medical History  Diagnosis Date  . Hypertension   . Hypercholesteremia     Past Surgical History  Procedure Laterality Date  . Dilation and curettage, diagnostic / therapeutic      Family History  Problem Relation Age of Onset  . Breast cancer Mother 74  . Cancer Father     Stomach  . Heart disease Brother     Social History:  reports that she has never smoked. She does not have any smokeless tobacco history on file. She reports that she does not drink alcohol or use illicit drugs.  Allergies: No Known Allergies   (Not in a hospital admission)  REVIEW OF SYSTEMS: A ROS was performed and pertinent positives and negatives are included in the history.  GENERAL: No fevers or chills. HEENT: No change in vision, no earache, sore throat or sinus congestion. NECK: No pain or stiffness. CARDIOVASCULAR: No chest pain or pressure. No palpitations. PULMONARY: No shortness of breath, cough or wheeze. GASTROINTESTINAL: No abdominal pain, nausea, vomiting or diarrhea, melena or bright red blood per rectum. GENITOURINARY: No urinary frequency, urgency, hesitancy or dysuria. MUSCULOSKELETAL: No joint or muscle pain, no back pain, no recent trauma. DERMATOLOGIC: No rash, no itching, no lesions. ENDOCRINE: No polyuria, polydipsia, no heat or cold intolerance. No recent change in weight. HEMATOLOGICAL: post menopausal bleeding. NEUROLOGIC: No headache, seizures, numbness, tingling or weakness. PSYCHIATRIC: No depression, no loss of interest in normal activity or change in sleep pattern.     Blood pressure 130/86.  Physical Exam:  HEENT:unremarkable Neck:Supple, midline, no thyroid megaly, no carotid  bruits Lungs:  Clear to auscultation no rhonchi's or wheezes Heart:Regular rate and rhythm, no murmurs or gallops Breast Exam: Symmetrical in appearance no palpable mass or  tenderness the supra-auricular actually lymphadenopathy Abdomen: Pendulous soft nontender no rebound or guarding Pelvic:BUS within normal limits Vagina: Dark blood noted Cervix: No lesions seen Uterus: Limited due to abdominal girth C. ultrasound report above Adnexa: Same as above Extremities: No cords, no edema Rectal: Not done   Assessment/Plan: Patient scheduled for resectoscopic polypectomy. She was on Megace 40 mg twice a day to help with her bleeding. Recent endometrial biopsy was benign with endometrioid polypoid lesion reported. The risk of surgery were discussed as follows:                        Patient was counseled as to the risk of surgery to include the following:  1. Infection (prohylactic antibiotics will be administered)  2. DVT/Pulmonary Embolism (prophylactic pneumo compression stockings will be used)  3.Trauma to internal organs requiring additional surgical procedure to repair any injury to     Internal organs requiring perhaps additional hospitalization days.  4.Hemmorhage requiring transfusion and blood products which carry risks such as  anaphylactic reaction, hepatitis and AIDS  Patient had received literature information on the procedure scheduled and all her questions were answered and fully accepts all risk.  We had requested medical clearance from her PCP Dr. Iona Beard Osei-Bonsu.    [facility] [id number]:81 AMTD@Note : This dictation was prepared with  Dragon/digital dictation along withSmart phrase technology. Any transcriptional errors that result from this process are unintentional.      Terrance Mass 06/22/2013, 11:05 AM  Note: This dictation was prepared with  Dragon/digital dictation along withSmart phrase technology. Any transcriptional errors that result from this process are unintentional.

## 2013-06-25 NOTE — Op Note (Signed)
   Operative Note  06/25/2013  8:38 AM  PATIENT:  Alison Beard  58 y.o. female  PRE-OPERATIVE DIAGNOSIS:  endometrial polyp, postmenopausal bleeding  POST-OPERATIVE DIAGNOSIS:  endometrial polyp, postmenopausal bleeding  PROCEDURE:  Procedure(s): DILATATION & CURETTAGE/HYSTEROSCOPY WITH MYOSURE ABLATION  SURGEON:  Surgeon(s): Terrance Mass, MD  ANESTHESIA:   general  FINDINGS: Multiple uterine polyps of various sizes. Some more hemorrhagic in appearance.  DESCRIPTION OF OPERATION:FINDINGS: The patient was taken to the operating room where she underwent a successful general endotracheal anesthesia. Patient had PAS stockings for DVT prophylaxis. She received 2 g of Ancef preop. Time out was undertaken to properly identify the patient and the proper operation schedule. The vagina and perineum were prepped and draped in usual sterile fashion. A red rubber Quentin Cornwall was inserted to empty the bladder is content for approximately 50 cc. Bimanual examination demonstrated an anteverted uterus. Patient's legs were in the high lithotomy position. A weighted speculum was placed in the posterior vaginal vault. A single-tooth tenaculum was placed on the anterior cervical lip. The uterus sounded to 11 cm. Pratt dilator were used to dilate the cervical canal to a 21 mm size. Pitressin (20 units in 30 cc of normal saline) was infiltrated into the cervical vaginal stroma at the 2, 4, 8, and 10:00 position for a total of 10 cc. The Hologic Myosure resectoscopic morcellator with a scope of 6.25 mm and an operating blade of 3.0 mm was introduced into the intrauterine cavity. 0.9% normal saline was the distending media. Inspection of the endometrial cavity demonstrated multiple multiple intrauterine polyps of various sizes and some were hemorrhagic. With the resectoscopic morcellator the multiple polyps that were removed were submitted for histological evaluation. Intermittently the suction curet 8 mm size was  introduced into the uterine cavity along with a blunt curette to completely evacuate the cavity of his multiple endometrial polyps. Fluid deficit was approximately 500 cc. The single-tooth tenaculum was removed patient was extubated transferred to recovery room stable vital signs blood loss was minimal. She received 30 mg of Toradol in route to the recovery room.    ESTIMATED BLOOD LOSS: Less than 50 cc  Intake/Output Summary (Last 24 hours) at 06/25/13 1771 Last data filed at 06/25/13 0800  Gross per 24 hour  Intake   1400 ml  Output      0 ml  Net   1400 ml     BLOOD ADMINISTERED:none   LOCAL MEDICATIONS USED:  OTHER Pitressin 20 units in 30 cc of normal saline dilution was injected at the cervical vaginal stroma at 2, 4, 8, and 10:00 position for a total of 10 cc  SPECIMEN:  Source of Specimen:  Uterine contents/polyps/curettings  DISPOSITION OF SPECIMEN:  PATHOLOGY  COUNTS:  YES  PLAN OF CARE: Transfer to PACU  [facility] HMD8:38 AMTD@  Note: This dictation was prepared with  Dragon/digital dictation along withSmart phrase technology. Any transcriptional errors that result from this process are unintentional.

## 2013-07-10 ENCOUNTER — Ambulatory Visit (INDEPENDENT_AMBULATORY_CARE_PROVIDER_SITE_OTHER): Payer: 59 | Admitting: Gynecology

## 2013-07-10 ENCOUNTER — Encounter: Payer: Self-pay | Admitting: Gynecology

## 2013-07-10 VITALS — BP 130/82

## 2013-07-10 DIAGNOSIS — Z09 Encounter for follow-up examination after completed treatment for conditions other than malignant neoplasm: Secondary | ICD-10-CM

## 2013-07-10 MED ORDER — MEGESTROL ACETATE 40 MG PO TABS: 40.0000 mg | ORAL_TABLET | Freq: Two times a day (BID) | ORAL | Status: AC

## 2013-07-10 NOTE — Progress Notes (Signed)
   Patient is a 58 year old who presented to the office today for a postop visit after having undergone resectoscopic polypectomy which was contributing to her postmenopausal uterine bleeding.Patient's daughter was present to translate since she is from [place]. Patient had similar situation back in 2009 whereby she had a D&C with normal pathology and bleeding stopped. Patient has history of hypertension and hypercholesterolemia as well as asthma and has been followed by Dr. Iona Beard Osei-Bonsu. Patient has informed me that she recently had a cardiology evaluation in [place]. Patient has had some spotting since her surgery.  Preoperatively the patient had an ultrasound and sonohysterogram in May 2015 with the following results:  Uterus measures 11.9 x 7.1 x 6.0 cm with endometrial stripe of 13.4 mm. Endometrium was described as prominent and avascular. Right and left ovary were atrophic after the cervix was cleansed with Betadine solution a sterile catheter was inserted into the uterine cavity and normal saline was instilled. A right posterior uterine defect measuring 29 x 12 x 17 mm was noted.  Patient's endometrial biopsy had demonstrated the following: Endometrium, biopsy  - BENIGN ENDOMETRIOID-TYPE POLYP(S).  - DEGENERATING SECRETORY-TYPE ENDOMETRIUM  - BENIGN ENDOCERVICAL-TYPE MUCOSA.  - THERE IS NO EVIDENCE OF HYPERPLASIA OR MALIGNANCY.  Pap smear 06/22/2013: Normal  On June 25 of this year she underwent resectoscopic polypectomy whereby multiple uterine polyps of various sizes were noted some were hemorrhagic in appearance. Pathology report from her surgery as follows:  Endometrial polyp - ENDOMETRIAL TYPE POLYP(S). - SECRETORY PHASE ENDOMETRIUM WITH PSEUDODECIDUALIZATION, CONSISTENT WITH HORMONE EFFECT. - NO HYPERPLASIA OR MALIGNANCY.  Exam: Patient overweight no acute distress Abdomen: Soft nontender no rebound or guarding Pelvic: And urethra Skene was within normal limits Vagina:  Some blood was noted in the vaginal vault Cervix: No active bleeding Bimanual examination limited due to patient's abdominal girth and no tenderness and no pelvic masses palpated Rectal exam: Not done  Assessment/plan: Postmenopausal bleeding with multiple intrauterine polyps which were resected. Pathology report benign. Patient will be kept on Megace 40 mg twice a day for the next 30 days and she's going on a trip back home to [place] and will not be back until September. I've given literature and information on the Mirena IUD for consideration if she continues to bleed after she finishes the Megace.

## 2013-11-02 ENCOUNTER — Encounter: Payer: Self-pay | Admitting: Gynecology

## 2015-03-29 ENCOUNTER — Other Ambulatory Visit: Payer: Self-pay

## 2015-03-29 DIAGNOSIS — Z1231 Encounter for screening mammogram for malignant neoplasm of breast: Secondary | ICD-10-CM

## 2015-04-19 ENCOUNTER — Ambulatory Visit
Admission: RE | Admit: 2015-04-19 | Discharge: 2015-04-19 | Disposition: A | Discharge status: Home / Self Care | Payer: BLUE CROSS/BLUE SHIELD | Source: Ambulatory Visit

## 2015-04-19 DIAGNOSIS — Z1231 Encounter for screening mammogram for malignant neoplasm of breast: Secondary | ICD-10-CM

## 2016-04-23 ENCOUNTER — Other Ambulatory Visit: Payer: Self-pay | Admitting: Family Medicine

## 2016-04-23 DIAGNOSIS — Z1231 Encounter for screening mammogram for malignant neoplasm of breast: Secondary | ICD-10-CM

## 2016-05-08 ENCOUNTER — Ambulatory Visit
Admission: RE | Admit: 2016-05-08 | Discharge: 2016-05-08 | Disposition: A | Discharge status: Home / Self Care | Payer: BLUE CROSS/BLUE SHIELD | Source: Ambulatory Visit | Attending: Family Medicine | Admitting: Family Medicine

## 2016-05-08 DIAGNOSIS — Z1231 Encounter for screening mammogram for malignant neoplasm of breast: Secondary | ICD-10-CM

## 2016-05-16 ENCOUNTER — Encounter: Payer: Self-pay | Admitting: Gynecology

## 2017-08-01 DIAGNOSIS — I350 Nonrheumatic aortic (valve) stenosis: Secondary | ICD-10-CM

## 2017-08-01 HISTORY — DX: Nonrheumatic aortic (valve) stenosis: I35.0

## 2017-10-01 HISTORY — PX: TRANSTHORACIC ECHOCARDIOGRAM: SHX275

## 2017-10-14 ENCOUNTER — Encounter: Payer: Self-pay | Admitting: Cardiology

## 2017-10-14 ENCOUNTER — Ambulatory Visit: Payer: BLUE CROSS/BLUE SHIELD | Admitting: Cardiology

## 2017-10-14 VITALS — BP 120/60 | HR 90 | Ht 61.0 in | Wt 286.8 lb

## 2017-10-14 DIAGNOSIS — R9431 Abnormal electrocardiogram [ECG] [EKG]: Secondary | ICD-10-CM

## 2017-10-14 DIAGNOSIS — I351 Nonrheumatic aortic (valve) insufficiency: Secondary | ICD-10-CM

## 2017-10-14 DIAGNOSIS — I350 Nonrheumatic aortic (valve) stenosis: Secondary | ICD-10-CM

## 2017-10-14 DIAGNOSIS — I10 Essential (primary) hypertension: Secondary | ICD-10-CM | POA: Diagnosis not present

## 2017-10-14 DIAGNOSIS — E785 Hyperlipidemia, unspecified: Secondary | ICD-10-CM

## 2017-10-14 NOTE — Assessment & Plan Note (Signed)
Currently on lovastatin. Last labs I saw her from October 2018: Total cholesterol 180, triglycerides 132, LDL 104, HDL 49. -->  Would probably want to shoot for an LDL less than 100 and if possible close to 70.  For now, will defer to PCP.

## 2017-10-14 NOTE — Progress Notes (Signed)
PCP: Aretta Nip, MD  Clinic Note: Chief Complaint  Patient presents with  . New Patient (Initial Visit)    Was given diagnosis of aortic stenosis while in [place], abnormal EKG  . Shortness of Breath    With exertion    HPI: Alison Beard is a morbidly obese 62 y.o. female with a history of hypertension, hyperlipidemia, DM 2 and long-standing abnormal EKG who is being seen today for the evaluation of abnormal EKG with reported aortic stenosis (aortic murmur on exam) at the request of Rankins, Bill Salinas, MD.  Alison Beard was last seen on September 10, 2017 for routine clinic visit.  Apparently she was seen by a cardiologist in [place] while there for an eye surgery.  Supposedly someone heard a murmur (which clearly exists) and she had an echocardiogram showing aortic stenosis.  She is now referred to establish a local cardiologist.  Recent Hospitalizations: Just for eye surgery while in [place] in July-August  Studies Personally Reviewed - (if available, images/films reviewed: From Epic Chart or [place])  Handwritten note from a physician in [place] indicating calcific aortic stenosis with gradient ~5 mHg (the number appears to be either 25 or 35)  Interval History: Alison is accompanied today by her sister who provides translation as she is Arabic only speaking.  She understands [place] but does not respond.  Very difficult historian.  She had no prior knowledge of having aortic stenosis.  She does get short of breath with activity and occasional fluid her heart rate going up with exercise.  She gets short of breath with more than just routine exertion but denies any chest pain.  She has not had any resting or exertional chest pain.  She has a little lower extremity swelling, but no PND or orthopnea.  She is morbidly obese but does not have a diagnosis of sleep apnea.  She denies any daytime sleepiness. Besides having her heart rate go up fast usually with exertion,  but sometimes at rest, she denies any prolonged rapid irregular heartbeat/palpitations to suspect that an arrhythmia.  No syncope or near syncope, TIA or amaurosis fugax.   ROS: A comprehensive was performed. Review of Systems  Constitutional: Negative for chills, fever, malaise/fatigue (Does tire easily, but did not get that far on explanation.) and weight loss.  HENT: Negative for congestion and nosebleeds.   Respiratory: Positive for shortness of breath (With exertion). Negative for cough and wheezing.   Gastrointestinal: Negative for blood in stool, heartburn and melena.  Genitourinary: Negative for hematuria.  Musculoskeletal: Positive for joint pain. Negative for falls and myalgias.  Neurological: Negative for dizziness and focal weakness.  Psychiatric/Behavioral: Negative.   All other systems reviewed and are negative.    I have reviewed and (if needed) personally updated the patient's problem list, medications, allergies, past medical and surgical history, social and family history.   Past Medical History:  Diagnosis Date  . Asthma    seasonal in spring - rarely used inhaler  . Calcific aortic stenosis    Apparently she had cardiology evaluation while in [place] over the summertime.  She has a handwritten note from her cardiologist indicating calcific aortic stenosis with a gradient of what looks like it to 25 or 35 mmHg.  Marland Kitchen Hypercholesteremia   . Hypertension   . Morbid obesity with BMI of 50.0-59.9, adult ([place])   . SVD (spontaneous vaginal delivery)    x 5    Past Surgical History:  Procedure Laterality Date  . DILATION  AND CURETTAGE, DIAGNOSTIC / THERAPEUTIC    . TOTAL ABDOMINAL HYSTERECTOMY W/ BILATERAL SALPINGOOPHORECTOMY     While living in [place]    Current Meds  Medication Sig  . acetaminophen (TYLENOL 8 HOUR) 650 MG CR tablet Take 1 tablet (650 mg total) by mouth every 8 (eight) hours as needed for pain.  Marland Kitchen albuterol (PROVENTIL HFA;VENTOLIN HFA) 108 (90 BASE)  MCG/ACT inhaler Inhale 2 puffs into the lungs every 6 (six) hours as needed for shortness of breath.  . hydrochlorothiazide (HYDRODIURIL) 25 MG tablet Take 25 mg by mouth daily.  Marland Kitchen ibuprofen (ADVIL,MOTRIN) 200 MG tablet Take 400 mg by mouth every 6 (six) hours as needed for mild pain or moderate pain.  Marland Kitchen lovastatin (MEVACOR) 20 MG tablet Take 20 mg by mouth at bedtime.  . megestrol (MEGACE) 40 MG tablet Take 1 tablet (40 mg total) by mouth 2 (two) times daily.  . metoCLOPramide (REGLAN) 10 MG tablet Take 1 tablet (10 mg total) by mouth 3 (three) times daily with meals.    No Known Allergies  Social History   Tobacco Use  . Smoking status: Never Smoker  . Smokeless tobacco: Never Used  Substance Use Topics  . Alcohol use: No  . Drug use: No   Social History   Social History Narrative   She is in native of [place].  Arabic speaking.  (Translation provided by her sister-Melissa.  She prefers having her sister over someone that she does not know).   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   She does drink coffee and tea maybe once a day.   She tries to walk regularly for exercise.    family history includes Breast cancer (age of onset: 8) in her mother; Cancer in her father; Coronary artery disease in her brother; Healthy in her sister; Heart attack (age of onset: 21) in her father; Heart disease in her brother.  Wt Readings from Last 3 Encounters:  10/14/17 286 lb 12.8 oz (130.1 kg)  06/23/13 300 lb (136.1 kg)  04/10/13 297 lb (134.7 kg)    PHYSICAL EXAM BP 120/60   Pulse 90   Ht 5\' 1"  (1.549 m)   Wt 286 lb 12.8 oz (130.1 kg)   BMI 54.19 kg/m  Physical Exam  Constitutional: She is oriented to person, place, and time. She appears well-developed.  Well-groomed.  Morbidly obese.  Relatively healthy appearing otherwise  HENT:  Head: Normocephalic and atraumatic.  Mouth/Throat: Oropharynx is clear and moist.  Eyes: Pupils are equal, round, and reactive to  light. Conjunctivae and EOM are normal. No scleral icterus.  Neck: Normal range of motion. Neck supple. Carotid bruit is not present.  Unable to treat the assess HJR or significant JVD.  Cardiovascular: Normal rate and intact distal pulses. PMI is not displaced (Unable to assess due to body habitus). Exam reveals no gallop and no friction rub.  Murmur heard.  Harsh crescendo-decrescendo midsystolic murmur is present with a grade of 3/6 at the upper right sternal border, upper left sternal border and lower left sternal border radiating to the neck. Pulmonary/Chest: Effort normal and breath sounds normal. No respiratory distress. She has no wheezes. She has no rales.  Abdominal: Soft. Bowel sounds are normal. She exhibits no distension. There is no tenderness. There is no rebound.  Unable to assess HSM due to body habitus.  Musculoskeletal: Normal range of motion. She exhibits edema (Trivial bilateral lower extreme any).  Neurological: She is alert and  oriented to person, place, and time. No cranial nerve deficit.  Psychiatric: She has a normal mood and affect. Her behavior is normal. Judgment and thought content normal.  Vitals reviewed.    Adult ECG Report  Rate: 90;  Rhythm: normal sinus rhythm and LVH by voltage and likely repolarization changes.  Nonspecific ST-T wave changes with T wave inversions in inferior leads that is probably repolarization related.  Obviously cannot exclude ischemia.;   Narrative Interpretation: Relatively stable EKG albeit abnormal.   Other studies Reviewed: Additional studies/ records that were reviewed today include:  Recent Labs: Lipids discussed in assessment and plan.   April 24, 2017: CBC W8.8, H/H 12.8/30.7, platelet 264.    ASSESSMENT / PLAN: Problem List Items Addressed This Visit    Abnormal EKG (Chronic)    Significant LVH noted on EKG with inferior T wave inversions is probably related to repolarization changes.  Rechecking 2D echo to assess her  aortic valve, but this will also determine any potential LVH as well.  The EKG is pretty stable compared to 2015.      Relevant Orders   EKG 12-Lead   ECHOCARDIOGRAM COMPLETE   Essential hypertension (Chronic)    Well-controlled blood pressure on HCTZ.      Hyperlipidemia, unspecified (Chronic)    Currently on lovastatin. Last labs I saw her from October 2018: Total cholesterol 180, triglycerides 132, LDL 104, HDL 49. -->  Would probably want to shoot for an LDL less than 100 and if possible close to 70.  For now, will defer to PCP.      Moderate aortic stenosis by prior echocardiography - Primary (Chronic)    She has a note indicating aortic stenosis, and she clearly has an aortic murmur on exam.  I think we need to just establish what exactly the gradient is to indicate severity and how) and followed up.  I would doubt very seriously get echocardiogram done in [place], nor would I be understand it.  It would be nice to have baseline pictures here. Plan: Check 2D echocardiogram.      Relevant Orders   EKG 12-Lead   ECHOCARDIOGRAM COMPLETE    Other Visit Diagnoses    Aortic ejection murmur         I spent a total of 60 minutes with the patient and chart review. >  50% of the time was spent in direct patient consultation.  A lot of this visit timeframe was related to language barrier using the sister for interpretation, and chronic explaining the past physiology of aortic stenosis that etc.  I did not have the referring physicians paperwork until halfway through the visit therefore complete HPI was done without chart review and then completed afterwards with chart review.  Current medicines are reviewed at length with the patient today.  (+/- concerns) none The following changes have been made:  None  Patient Instructions  Medication Instructions:   NOT NEEDED If you need a refill on your cardiac medications before your next appointment, please call your pharmacy.   Lab  work: NOT NEEDED If you have labs (blood work) drawn today and your tests are completely normal, you will receive your results only by: Marland Kitchen MyChart Message (if you have MyChart) OR . A paper copy in the mail If you have any lab test that is abnormal or we need to change your treatment, we will call you to review the results.  Testing/Procedures:   SCHEDULE AT [place] [facility] Your  physician has requested that you have an echocardiogram. Echocardiography is a painless test that uses sound waves to create images of your heart. It provides your doctor with information about the size and shape of your heart and how well your heart's chambers and valves are working. This procedure takes approximately one hour. There are no restrictions for this procedure.    Follow-Up: At [facility], you and your health needs are our priority.  As part of our continuing mission to provide you with exceptional heart care, we have created designated Provider Care Teams.  These Care Teams include your primary Cardiologist (physician) and Advanced Practice Providers (APPs -  Physician Assistants and Nurse Practitioners) who all work together to provide you with the care you need, when you need it. You will need a follow up appointment in 1 months.You will see DR HARDING.   Any Other Special Instructions Will Be Listed Below (If Applicable).       Studies Ordered:   Orders Placed This Encounter  Procedures  . EKG 12-Lead  . ECHOCARDIOGRAM COMPLETE      Glenetta Hew, M.D., M.S. Interventional Cardiologist   Pager # [phone] Phone # [phone] [address] [place], [place] [address]   Thank you for choosing Heartcare at [facility]!!

## 2017-10-14 NOTE — Assessment & Plan Note (Signed)
Significant LVH noted on EKG with inferior T wave inversions is probably related to repolarization changes.  Rechecking 2D echo to assess her aortic valve, but this will also determine any potential LVH as well.  The EKG is pretty stable compared to 2015.

## 2017-10-14 NOTE — Assessment & Plan Note (Signed)
Well-controlled blood pressure on HCTZ.

## 2017-10-14 NOTE — Patient Instructions (Signed)
Medication Instructions:   NOT NEEDED If you need a refill on your cardiac medications before your next appointment, please call your pharmacy.   Lab work: NOT NEEDED If you have labs (blood work) drawn today and your tests are completely normal, you will receive your results only by: Marland Kitchen MyChart Message (if you have MyChart) OR . A paper copy in the mail If you have any lab test that is abnormal or we need to change your treatment, we will call you to review the results.  Testing/Procedures:   SCHEDULE AT [place] has requested that you have an echocardiogram. Echocardiography is a painless test that uses sound waves to create images of your heart. It provides your doctor with information about the size and shape of your heart and how well your heart's chambers and valves are working. This procedure takes approximately one hour. There are no restrictions for this procedure.    Follow-Up: At [facility], you and your health needs are our priority.  As part of our continuing mission to provide you with exceptional heart care, we have created designated Provider Care Teams.  These Care Teams include your primary Cardiologist (physician) and Advanced Practice Providers (APPs -  Physician Assistants and Nurse Practitioners) who all work together to provide you with the care you need, when you need it. You will need a follow up appointment in 1 months.You will see DR HARDING.   Any Other Special Instructions Will Be Listed Below (If Applicable).

## 2017-10-14 NOTE — Assessment & Plan Note (Signed)
She has a note indicating aortic stenosis, and she clearly has an aortic murmur on exam.  I think we need to just establish what exactly the gradient is to indicate severity and how) and followed up.  I would doubt very seriously get echocardiogram done in [place], nor would I be understand it.  It would be nice to have baseline pictures here. Plan: Check 2D echocardiogram.

## 2017-10-15 ENCOUNTER — Ambulatory Visit (HOSPITAL_COMMUNITY): Payer: BLUE CROSS/BLUE SHIELD | Attending: Cardiology

## 2017-10-15 ENCOUNTER — Other Ambulatory Visit: Payer: Self-pay

## 2017-10-15 DIAGNOSIS — I350 Nonrheumatic aortic (valve) stenosis: Secondary | ICD-10-CM | POA: Diagnosis present

## 2017-10-15 DIAGNOSIS — R9431 Abnormal electrocardiogram [ECG] [EKG]: Secondary | ICD-10-CM | POA: Diagnosis present

## 2017-10-17 ENCOUNTER — Encounter: Payer: Self-pay | Admitting: *Deleted

## 2017-12-06 ENCOUNTER — Encounter: Payer: Self-pay | Admitting: Cardiology

## 2017-12-06 ENCOUNTER — Ambulatory Visit: Payer: BLUE CROSS/BLUE SHIELD | Admitting: Cardiology

## 2017-12-06 VITALS — BP 155/91 | HR 107 | Ht 61.5 in | Wt 286.2 lb

## 2017-12-06 DIAGNOSIS — I350 Nonrheumatic aortic (valve) stenosis: Secondary | ICD-10-CM

## 2017-12-06 DIAGNOSIS — R9431 Abnormal electrocardiogram [ECG] [EKG]: Secondary | ICD-10-CM | POA: Diagnosis not present

## 2017-12-06 DIAGNOSIS — R000 Tachycardia, unspecified: Secondary | ICD-10-CM

## 2017-12-06 DIAGNOSIS — I10 Essential (primary) hypertension: Secondary | ICD-10-CM | POA: Diagnosis not present

## 2017-12-06 DIAGNOSIS — J4541 Moderate persistent asthma with (acute) exacerbation: Secondary | ICD-10-CM

## 2017-12-06 DIAGNOSIS — E785 Hyperlipidemia, unspecified: Secondary | ICD-10-CM

## 2017-12-06 MED ORDER — METOPROLOL SUCCINATE ER 25 MG PO TB24: 25.0000 mg | ORAL_TABLET | Freq: Every day | ORAL | 3 refills | Status: AC

## 2017-12-06 NOTE — Patient Instructions (Addendum)
Medication Instructions:   START  METOPROLOL SUCCINATE 25 MG  ONE DAILY    If you need a refill on your cardiac medications before your next appointment, please call your pharmacy.   Lab work:  NOT NEEDED If you have labs (blood work) drawn today and your tests are completely normal, you will receive your results only by: Marland Kitchen MyChart Message (if you have MyChart) OR . A paper copy in the mail If you have any lab test that is abnormal or we need to change your treatment, we will call you to review the results.  Testing/Procedures: SCHEDULE AT [place] [facility]-- April 2020 Your physician has requested that you have an echocardiogram. Echocardiography is a painless test that uses sound waves to create images of your heart. It provides your doctor with information about the size and shape of your heart and how well your heart's chambers and valves are working. This procedure takes approximately one hour. There are no restrictions for this procedure.   Follow-Up: At [facility], you and your health needs are our priority.  As part of our continuing mission to provide you with exceptional heart care, we have created designated Provider Care Teams.  These Care Teams include your primary Cardiologist (physician) and Advanced Practice Providers (APPs -  Physician Assistants and Nurse Practitioners) who all work together to provide you with the care you need, when you need it. You will need a follow up appointment in 4 months  April 2020.  Please call our office 2 months in advance to schedule this appointment.  You may see Glenetta Hew, MD or one of the following Advanced Practice Providers on your designated Care Team:   Rosaria Ferries, PA-C . Jory Sims, DNP, ANP  Any Other Special Instructions Will Be Listed Below (If Applicable).   YOU NEED   TO HAVE ANTIBIOTIC IF YOU HAVE ANY  DENTAL OR GI PROCEDURES - CONTACT OFFICE

## 2017-12-06 NOTE — Progress Notes (Signed)
PCP: Aretta Nip, MD  Clinic Note: Chief Complaint  Patient presents with  . Follow-up    Echo Results  . Shortness of Breath    current URI Sx exacerbating "asthma"    HPI: Alison Beard is a morbidly obese 62 y.o. female with a PMH of HTN, HLD, DM-2, morbid Obesity & Moderate-severe Aortic Stenosis (AS) who is being seen today for follow-up evaluation after 2D Echocardiogram to assess AS. Apparently she was seen by a cardiologist in [place] while there for an eye surgery.  Supposedly someone heard a murmur (which clearly exists) and she had an echocardiogram showing "Moderate AS- per her sister, gradient was ~35 mmHg.   Alison Beard was last seen in Oct for initial cardiology consultation for aortic stenosis evaluation.   on September 10, 2017 for routine clinic visit.   She is now referred to establish a local cardiologist.  Recent Hospitalizations:  n/a  Studies Personally Reviewed - (if available, images/films reviewed: From Epic Chart or Care Everywhere)  2D Echo 10/15/2017: EF 55-60%.  Severe AS with Mod AI - mean gradient 40 mmHg (recheck 6 months), Gr 2 DD. No RWMA ; per [place] report - was 35 mmHg.   Interval History: Alison  is accompanied today by her sister as well as a contracted Radio broadcast assistant (from [place]) - she is Arabic only speaking.  She understands [place] but does not respond.  Very difficult historian.  As far as I can tell, the main symptom that she does note is dyspnea with exertion, but she also has arthritis pains etc. which could be contributing to that.  She is also morbidly obese and essentially sedentary.  She has been trying to do exercise, and when she starts to get up and do things he gets a little dizzy, mostly orthostatic. She denies any chest pain or pressure with rest or exertion.  She does not sleep lying down flat probably because of GERD and other issues along with dyspnea, but denies any PND.  Has not been diagnosed with sleep  apnea denies any daytime sleepiness. She states that her heart rate does go up with exertion, but does not seem to be irregular.  She is not really noting any rapid irregular heartbeats or palpitations.  No syncope/near syncope or TIA/amaurosis fugax..  It is hard to tell was going on right now, because she is recovering from upper respiratory tract infection/flu that is exacerbated her asthma.  So she has been more short of breath than usual.  ROS: A comprehensive was performed. Review of Systems  Constitutional: Negative for chills, fever, malaise/fatigue (Does tire easily, but did not get that far on explanation.) and weight loss.  HENT: Positive for congestion. Negative for nosebleeds.   Respiratory: Positive for cough, sputum production (White/yellow sputum), shortness of breath (With exertion) and wheezing.   Gastrointestinal: Negative for abdominal pain, blood in stool, heartburn and melena.  Genitourinary: Negative for dysuria and hematuria.  Musculoskeletal: Positive for joint pain. Negative for falls and myalgias.  Neurological: Negative for dizziness and focal weakness.  Psychiatric/Behavioral: Negative.  Negative for depression (Perhaps dysthymia) and memory loss. The patient is not nervous/anxious.   All other systems reviewed and are negative.   I have reviewed and (if needed) personally updated the patient's problem list, medications, allergies, past medical and surgical history, social and family history.   Past Medical History:  Diagnosis Date  . Asthma    seasonal in spring - rarely used inhaler  . Hypercholesteremia   .  Hypertension   . Moderate to severe aortic stenosis 08/2017   Apparently she had cardiology evaluation while in [place] over the summertime -- handwritten note from her cardiologist indicating calcific aortic stenosis with a gradient of 35 mmHg. -- f/u Echo in [place] 11/2017 - mean Gradient ~40 mmHg = now "SEVERE"  . Morbid obesity with BMI of 50.0-59.9,  adult ([place])   . SVD (spontaneous vaginal delivery)    x 5    Past Surgical History:  Procedure Laterality Date  . DILATION AND CURETTAGE, DIAGNOSTIC / THERAPEUTIC    . TOTAL ABDOMINAL HYSTERECTOMY W/ BILATERAL SALPINGOOPHORECTOMY     While living in [place]  . TRANSTHORACIC ECHOCARDIOGRAM  10/2017   EF 55-60%.  Severe AS with Mod AI - mean gradient 40 mmHg (recheck 6 months), Gr 2 DD. No RWMA ; per [place] report (08/2017) mean gradient was 35 mmHg.     Current Meds  Medication Sig  . acetaminophen (TYLENOL 8 HOUR) 650 MG CR tablet Take 1 tablet (650 mg total) by mouth every 8 (eight) hours as needed for pain.  Marland Kitchen albuterol (PROVENTIL HFA;VENTOLIN HFA) 108 (90 BASE) MCG/ACT inhaler Inhale 2 puffs into the lungs every 6 (six) hours as needed for shortness of breath.  . hydrochlorothiazide (HYDRODIURIL) 25 MG tablet Take 25 mg by mouth daily.  Marland Kitchen ibuprofen (ADVIL,MOTRIN) 200 MG tablet Take 400 mg by mouth every 6 (six) hours as needed for mild pain or moderate pain.  Marland Kitchen lovastatin (MEVACOR) 20 MG tablet Take 20 mg by mouth at bedtime.  . megestrol (MEGACE) 40 MG tablet Take 1 tablet (40 mg total) by mouth 2 (two) times daily.  . metoCLOPramide (REGLAN) 10 MG tablet Take 1 tablet (10 mg total) by mouth 3 (three) times daily with meals.    No Known Allergies  Social History   Tobacco Use  . Smoking status: Never Smoker  . Smokeless tobacco: Never Used  Substance Use Topics  . Alcohol use: No  . Drug use: No   Social History   Social History Narrative   She is in native of [place].  Arabic speaking.  (Translation provided by her sister-Melissa.  She prefers having her sister over someone that she does not know).   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   She does drink coffee and tea maybe once a day.   She tries to walk regularly for exercise.    family history includes Breast cancer (age of onset: 27) in her mother; Cancer in her father; Coronary artery disease  in her brother; Healthy in her sister; Heart attack (age of onset: 96) in her father; Heart disease in her brother.  Wt Readings from Last 3 Encounters:  12/06/17 286 lb 3.2 oz (129.8 kg)  10/14/17 286 lb 12.8 oz (130.1 kg)  06/23/13 300 lb (136.1 kg)    PHYSICAL EXAM BP (!) 155/91   Pulse (!) 107   Ht 5' 1.5" (1.562 m)   Wt 286 lb 3.2 oz (129.8 kg)   SpO2 95%   BMI 53.20 kg/m  Physical Exam  Constitutional: She is oriented to person, place, and time. She appears well-developed.  Well-groomed.  Morbidly obese.  Relatively healthy appearing otherwise  HENT:  Head: Normocephalic and atraumatic.  Mouth/Throat: Oropharynx is clear and moist.  Eyes: Pupils are equal, round, and reactive to light. Conjunctivae and EOM are normal. No scleral icterus.  Neck: Normal range of motion. Neck supple. No JVD present. Carotid bruit is  not present.  Unable to treat the assess HJR or significant JVD.  Cardiovascular: Normal rate and intact distal pulses. PMI is not displaced (Unable to assess due to body habitus). Exam reveals no gallop and no friction rub.  Murmur heard.  Harsh crescendo-decrescendo midsystolic murmur is present with a grade of 3/6 at the upper right sternal border, upper left sternal border and lower left sternal border radiating to the neck. Pulmonary/Chest: Effort normal. No respiratory distress. She has wheezes (Diffuse mild bilateral). She has no rales.  Abdominal: Soft. Bowel sounds are normal. She exhibits no distension. There is no tenderness. There is no rebound.  Unable to assess HSM due to body habitus.  Musculoskeletal: Normal range of motion. She exhibits edema (Trivial bilateral lower extreme any).  Neurological: She is alert and oriented to person, place, and time.  Psychiatric: She has a normal mood and affect. Her behavior is normal. Judgment and thought content normal.  Vitals reviewed.    Adult ECG Report Not Checked  Other studies Reviewed: Additional  studies/ records that were reviewed today include:  Recent Labs: Lipids discussed in assessment and plan.   April 24, 2017: CBC W8.8, H/H 12.8/30.7, platelet 264.    ASSESSMENT / PLAN: Problem List Items Addressed This Visit    Abnormal EKG (Chronic)   Relevant Orders   ECHOCARDIOGRAM COMPLETE   Asthma   Essential hypertension - Primary (Chronic)    They were much better controlled last time I saw her, not as good today.  She is on HCTZ only.  With tachycardia and hypertension today, will add low-dose Toprol.  (Hopefully the beta 1 selective beta-blocker will not exacerbate asthma.)      Relevant Medications   metoprolol succinate (TOPROL XL) 25 MG 24 hr tablet   Hyperlipidemia, unspecified (Chronic)    Has not had labs checked since October 2018 that I can see.  Would probably recommend reevaluating.  If not checked and labs not available by the time I see her back, we will go and check and likely convert to a more potent statin      Relevant Medications   metoprolol succinate (TOPROL XL) 25 MG 24 hr tablet   Severe aortic stenosis by prior echocardiography (Chronic)    Confirmed moderate-severe now severe aortic stenosis by echocardiogram.  We reviewed symptoms of concern.  The only symptoms she knows now is exertional dyspnea which I think is probably is much attributed to her obesity, deconditioning and underlying asthma with URI symptoms right now.  She is not really having other heart failure symptoms.  I do not think she truly has orthopnea, more so just body habitus related dyspnea.  Continue to monitor symptoms.  We discussed concerning symptoms: CHF type symptoms, angina, and syncope type symptoms.  Recheck echocardiogram in roughly 4 months -would be a follow-up echo after 6 months from last..      Relevant Medications   metoprolol succinate (TOPROL XL) 25 MG 24 hr tablet   Other Relevant Orders   ECHOCARDIOGRAM COMPLETE   Sinus tachycardia    Quite tachycardic today.   We will add Toprol 25 mg daily.        I spent a total of 60 minutes with the patient and chart review. >  50% of the time was spent in direct patient consultation.  A lot of this visit timeframe was related to language barrier using the sister for interpretation, and chronic explaining the past physiology of aortic stenosis that etc.  I did  not have the referring physicians paperwork until halfway through the visit therefore complete HPI was done without chart review and then completed afterwards with chart review.  Current medicines are reviewed at length with the patient today.  (+/- concerns) none The following changes have been made:  None  Patient Instructions  Medication Instructions:   START  METOPROLOL SUCCINATE 25 MG  ONE DAILY    If you need a refill on your cardiac medications before your next appointment, please call your pharmacy.   Lab work:  NOT NEEDED If you have labs (blood work) drawn today and your tests are completely normal, you will receive your results only by: Marland Kitchen MyChart Message (if you have MyChart) OR . A paper copy in the mail If you have any lab test that is abnormal or we need to change your treatment, we will call you to review the results.  Testing/Procedures: SCHEDULE AT [facility]-- April 2020 Your physician has requested that you have an echocardiogram. Echocardiography is a painless test that uses sound waves to create images of your heart. It provides your doctor with information about the size and shape of your heart and how well your heart's chambers and valves are working. This procedure takes approximately one hour. There are no restrictions for this procedure.   Follow-Up: At [facility], you and your health needs are our priority.  As part of our continuing mission to provide you with exceptional heart care, we have created designated Provider Care Teams.  These Care Teams include your primary Cardiologist (physician)  and Advanced Practice Providers (APPs -  Physician Assistants and Nurse Practitioners) who all work together to provide you with the care you need, when you need it. You will need a follow up appointment in 4 months  April 2020.  Please call our office 2 months in advance to schedule this appointment.  You may see Glenetta Hew, MD or one of the following Advanced Practice Providers on your designated Care Team:   Rosaria Ferries, PA-C . Jory Sims, DNP, ANP  Any Other Special Instructions Will Be Listed Below (If Applicable).   YOU NEED   TO HAVE ANTIBIOTIC IF YOU HAVE ANY  DENTAL OR GI PROCEDURES - CONTACT OFFICE    Studies Ordered:   Orders Placed This Encounter  Procedures  . ECHOCARDIOGRAM COMPLETE      Glenetta Hew, M.D., M.S. Interventional Cardiologist   Pager # [phone] Phone # [phone] [address] [place], [place] [address]   Thank you for choosing Heartcare at [facility]!!

## 2017-12-08 ENCOUNTER — Encounter: Payer: Self-pay | Admitting: Cardiology

## 2017-12-08 NOTE — Assessment & Plan Note (Addendum)
They were much better controlled last time I saw her, not as good today.  She is on HCTZ only.  With tachycardia and hypertension today, will add low-dose Toprol.  (Hopefully the beta 1 selective beta-blocker will not exacerbate asthma.)

## 2017-12-08 NOTE — Assessment & Plan Note (Signed)
Quite tachycardic today.  We will add Toprol 25 mg daily.

## 2017-12-08 NOTE — Assessment & Plan Note (Addendum)
Has not had labs checked since October 2018 that I can see.  Would probably recommend reevaluating.  If not checked and labs not available by the time I see her back, we will go and check and likely convert to a more potent statin

## 2017-12-08 NOTE — Assessment & Plan Note (Signed)
Confirmed moderate-severe now severe aortic stenosis by echocardiogram.  We reviewed symptoms of concern.  The only symptoms she knows now is exertional dyspnea which I think is probably is much attributed to her obesity, deconditioning and underlying asthma with URI symptoms right now.  She is not really having other heart failure symptoms.  I do not think she truly has orthopnea, more so just body habitus related dyspnea.  Continue to monitor symptoms.  We discussed concerning symptoms: CHF type symptoms, angina, and syncope type symptoms.  Recheck echocardiogram in roughly 4 months -would be a follow-up echo after 6 months from last..

## 2018-03-17 ENCOUNTER — Telehealth: Payer: Self-pay | Admitting: Cardiology

## 2018-03-17 NOTE — Telephone Encounter (Signed)
New Message     Pts daughter is calling because she said the pt changed her insurance to [employer] and she wants to know if this test will be covered   Please call back

## 2018-03-17 NOTE — Telephone Encounter (Signed)
DPR on file. Pt daughter would like to know if recently ordered echocardiogram will be covered by her mother's insurance has been changed. Advised pt daughter to contact insurance company and provided her with CPT code (Severe aortic stenosis by prior echocardiography [I35.0]) for study. Pt daughter verbalized understanding

## 2018-04-08 ENCOUNTER — Telehealth: Payer: Self-pay | Admitting: Cardiology

## 2018-04-08 NOTE — Telephone Encounter (Signed)
I have talked to patient's daughter and the patient has unchanged symptoms. In fact she has been more active as she is trying to loose weight. Her echocardiogram can be postpone for 2 months. She agrees with that. Please call her with new appointment date. Thank you, Ena Dawley

## 2018-04-09 NOTE — Telephone Encounter (Signed)
Great.  Thank you I think it can easily be postponed. We will just delay her follow-up visit as well.  Glenetta Hew, MD

## 2018-04-11 ENCOUNTER — Ambulatory Visit (HOSPITAL_COMMUNITY): Payer: BLUE CROSS/BLUE SHIELD

## 2018-05-14 ENCOUNTER — Telehealth (HOSPITAL_COMMUNITY): Payer: Self-pay | Admitting: Radiology

## 2018-05-14 NOTE — Telephone Encounter (Signed)
Left message to call office to get scheduled for an echocardiogram.

## 2018-05-20 ENCOUNTER — Telehealth (HOSPITAL_COMMUNITY): Payer: Self-pay | Admitting: Radiology

## 2018-05-20 NOTE — Telephone Encounter (Signed)
Spoke with daughter to schedule echocardiogram. Daughter expressed concerns regarding insurance. She will call back to reschedule. We will call back if we have not heard from her in a month.

## 2019-03-03 ENCOUNTER — Telehealth: Payer: Self-pay | Admitting: Cardiology

## 2019-03-03 NOTE — Telephone Encounter (Signed)
Patient has different insurance at this time, that only allows her to go to [facility] to care.

## 2019-03-19 ENCOUNTER — Ambulatory Visit: Payer: BLUE CROSS/BLUE SHIELD | Attending: Internal Medicine

## 2019-03-19 DIAGNOSIS — Z23 Encounter for immunization: Secondary | ICD-10-CM

## 2019-03-19 NOTE — Progress Notes (Signed)
   Covid-19 Vaccination Clinic  Name:  Javonna Grapes    MRN: [id number] DOB: Feb 04, 1955  03/19/2019  Ms. Al-Nimri was observed post Covid-19 immunization for 15 minutes without incident. She was provided with Vaccine Information Sheet and instruction to access the V-Safe system.   Ms. Caryl was instructed to call 911 with any severe reactions post vaccine: Marland Kitchen Difficulty breathing  . Swelling of face and throat  . A fast heartbeat  . A bad rash all over body  . Dizziness and weakness   Immunizations Administered    Name Date Dose VIS Date Route   Moderna COVID-19 Vaccine 03/19/2019 11:46 AM 0.5 mL 12/02/2018 Intramuscular   Manufacturer: Moderna   Lot: [id number]   [id number]

## 2019-04-21 ENCOUNTER — Ambulatory Visit: Payer: Self-pay | Attending: Family

## 2019-04-21 DIAGNOSIS — Z23 Encounter for immunization: Secondary | ICD-10-CM

## 2019-04-21 NOTE — Progress Notes (Signed)
   Covid-19 Vaccination Clinic  Name:  Alison Beard    MRN: [id number] DOB: [birth year]  04/21/2019  Alison Beard was observed post Covid-19 immunization for 15 minutes without incident. She was provided with Vaccine Information Sheet and instruction to access the V-Safe system.   Alison Beard was instructed to call 911 with any severe reactions post vaccine: Marland Kitchen Difficulty breathing  . Swelling of face and throat  . A fast heartbeat  . A bad rash all over body  . Dizziness and weakness   Immunizations Administered    Name Date Dose VIS Date Route   Moderna COVID-19 Vaccine 04/21/2019 11:13 AM 0.5 mL 12/2018 Intramuscular   Manufacturer: Moderna   Lot: [id number]   Alison MarBE:[id number]

## 2019-07-27 ENCOUNTER — Telehealth (HOSPITAL_COMMUNITY): Payer: Self-pay

## 2019-07-27 NOTE — Telephone Encounter (Signed)
Attempted to call patient in regards to Cardiac Rehab - LM on VM 

## 2019-09-14 ENCOUNTER — Telehealth: Payer: Self-pay

## 2019-09-14 ENCOUNTER — Telehealth: Payer: Self-pay | Admitting: Cardiovascular Disease

## 2019-09-14 NOTE — Telephone Encounter (Signed)
Spoke to the therapist, Timmothy Sours. He was calling about the patient's heart rate. He has been advised that according to epic the patient has not been seen here in the office for 2 years and was now being seen at [facility]. He stated that the patient had an appointment with Dr. Farris Has but this was a doctor at [facility] and not [facility].   He will call over to [facility] for recommendations.

## 2019-09-17 NOTE — Telephone Encounter (Signed)
Close this encounter

## 2019-11-09 ENCOUNTER — Telehealth (HOSPITAL_COMMUNITY): Payer: Self-pay

## 2019-11-09 NOTE — Telephone Encounter (Signed)
Called and spoke with pt daughter Jonelle Sidle who stated pt has [facility] and has to go to a [facility] doctor and not [facility]. She stated she will contact Dr. Gery Pray and contact CR with [facility].  Closed referral

## 2021-01-27 ENCOUNTER — Emergency Department (HOSPITAL_COMMUNITY)
Admission: EM | Admit: 2021-01-27 | Discharge: 2021-01-27 | Disposition: A | Discharge status: Home / Self Care | Payer: BLUE CROSS/BLUE SHIELD | Attending: Emergency Medicine | Admitting: Emergency Medicine

## 2021-01-27 ENCOUNTER — Emergency Department (HOSPITAL_COMMUNITY): Payer: BLUE CROSS/BLUE SHIELD

## 2021-01-27 ENCOUNTER — Other Ambulatory Visit: Payer: Self-pay

## 2021-01-27 ENCOUNTER — Encounter (HOSPITAL_COMMUNITY): Payer: Self-pay

## 2021-01-27 DIAGNOSIS — R1031 Right lower quadrant pain: Secondary | ICD-10-CM | POA: Insufficient documentation

## 2021-01-27 DIAGNOSIS — Z7901 Long term (current) use of anticoagulants: Secondary | ICD-10-CM | POA: Diagnosis not present

## 2021-01-27 DIAGNOSIS — R109 Unspecified abdominal pain: Secondary | ICD-10-CM

## 2021-01-27 DIAGNOSIS — R350 Frequency of micturition: Secondary | ICD-10-CM | POA: Diagnosis not present

## 2021-01-27 DIAGNOSIS — Z79899 Other long term (current) drug therapy: Secondary | ICD-10-CM | POA: Insufficient documentation

## 2021-01-27 LAB — URINALYSIS, ROUTINE W REFLEX MICROSCOPIC
Bacteria, UA: NONE SEEN
Bilirubin Urine: NEGATIVE
Glucose, UA: >=500 mg/dL — AB
Hgb urine dipstick: NEGATIVE
Ketones, ur: NEGATIVE mg/dL
Leukocytes,Ua: NEGATIVE
Nitrite: NEGATIVE
Protein, ur: NEGATIVE mg/dL
Specific Gravity, Urine: 1.021 (ref 1.005–1.030)
pH: 5 (ref 5.0–8.0)

## 2021-01-27 LAB — COMPREHENSIVE METABOLIC PANEL
ALT: 16 U/L (ref 0–44)
AST: 18 U/L (ref 15–41)
Albumin: 4 g/dL (ref 3.5–5.0)
Alkaline Phosphatase: 65 U/L (ref 38–126)
Anion gap: 10 (ref 5–15)
BUN: 15 mg/dL (ref 8–23)
CO2: 23 mmol/L (ref 22–32)
Calcium: 9.4 mg/dL (ref 8.9–10.3)
Chloride: 101 mmol/L (ref 98–111)
Creatinine, Ser: 0.63 mg/dL (ref 0.44–1.00)
GFR, Estimated: >60 mL/min (ref >60)
Glucose, Bld: 89 mg/dL (ref 70–99)
Potassium: 4.2 mmol/L (ref 3.5–5.1)
Sodium: 134 mmol/L — ABNORMAL LOW (ref 135–145)
Total Bilirubin: 0.8 mg/dL (ref 0.3–1.2)
Total Protein: 8 g/dL (ref 6.5–8.1)

## 2021-01-27 LAB — LIPASE, BLOOD: Lipase: 32 U/L (ref 11–51)

## 2021-01-27 LAB — CBC WITH DIFFERENTIAL/PLATELET
Abs Immature Granulocytes: 0.02 10*3/uL (ref 0.00–0.07)
Basophils Absolute: 0.1 10*3/uL (ref 0.0–0.1)
Basophils Relative: 1 %
Eosinophils Absolute: 0.1 10*3/uL (ref 0.0–0.5)
Eosinophils Relative: 2 %
HCT: 42.1 % (ref 36.0–46.0)
Hemoglobin: 13.6 g/dL (ref 12.0–15.0)
Immature Granulocytes: 0 %
Lymphocytes Relative: 22 %
Lymphs Abs: 1.5 10*3/uL (ref 0.7–4.0)
MCH: 28.6 pg (ref 26.0–34.0)
MCHC: 32.3 g/dL (ref 30.0–36.0)
MCV: 88.6 fL (ref 80.0–100.0)
Monocytes Absolute: 0.6 10*3/uL (ref 0.1–1.0)
Monocytes Relative: 8 %
Neutro Abs: 4.6 10*3/uL (ref 1.7–7.7)
Neutrophils Relative %: 67 %
Platelets: 218 10*3/uL (ref 150–400)
RBC: 4.75 MIL/uL (ref 3.87–5.11)
RDW: 12.9 % (ref 11.5–15.5)
WBC: 6.9 10*3/uL (ref 4.0–10.5)
nRBC: 0 % (ref 0.0–0.2)

## 2021-01-27 MED ORDER — OXYCODONE HCL 5 MG PO TABS
5.0000 mg | ORAL_TABLET | Freq: Once | ORAL | Status: AC
  Administered 2021-01-27: 5 mg via ORAL
  Filled 2021-01-27: qty 1

## 2021-01-27 MED ORDER — SENNOSIDES-DOCUSATE SODIUM 8.6-50 MG PO TABS
1.0000 | ORAL_TABLET | Freq: Every day | ORAL | 0 refills | Status: AC | PRN
Start: 2021-01-27 — End: ?

## 2021-01-27 MED ORDER — OXYCODONE HCL 5 MG PO TABS: 5.0000 mg | ORAL_TABLET | Freq: Four times a day (QID) | ORAL | 0 refills | Status: AC | PRN

## 2021-01-27 NOTE — ED Triage Notes (Signed)
Patient's son reports that the patient has had right flank pain x 7 days and worsening. Patient denies n/v/D or dysuria, but does c/o urinary frequency.

## 2021-01-27 NOTE — Discharge Instructions (Addendum)
Please call your doctors office to schedule follow-up appointment this week.

## 2021-01-27 NOTE — ED Provider Triage Note (Signed)
Emergency Medicine Provider Triage Evaluation Note  Alison Beard , a 66 y.o. female  was evaluated in triage.  Pt complains of right side abdominal pain x1 week associated with dysuria.  Review of Systems  Positive: Right side abdominal pain and dysuria Negative: Fevers, chills, nausea, vomiting  Physical Exam  There were no vitals taken for this visit. Gen:   Awake, no distress   Resp:  Normal effort  MSK:   Moves extremities without difficulty  Other:    Medical Decision Making  Medically screening exam initiated at 11:24 AM.  Appropriate orders placed.  Alison Beard was informed that the remainder of the evaluation will be completed by another provider, this initial triage assessment does not replace that evaluation, and the importance of remaining in the ED until their evaluation is complete.     Tacy Learn, PA-C 01/27/21 1125

## 2021-01-27 NOTE — ED Provider Notes (Signed)
[facility] DEPT Provider Note   CSN: [id number] Arrival date & time: 01/27/21  1116     History  Chief Complaint  Patient presents with   Flank Pain   Urinary Frequency    Alison Beard is a 66 y.o. female presented emergency department with lower back pain for about a week.  She reports gradual onset of pain in her lower back.  It is significantly worse with walking and movement, although it hurts all the time.  She denies nausea, vomiting, diarrhea or constipation.  She denies dysuria.  She is never had this pain before.  Denies any history of abdominal surgeries.  She has been using Tylenol at home.  She cannot take NSAIDs because she is on Xarelto for cardiac condition.  HPI     Home Medications Prior to Admission medications   Medication Sig Start Date End Date Taking? Authorizing Provider  oxyCODONE (ROXICODONE) 5 MG immediate release tablet Take 1 tablet (5 mg total) by mouth every 6 (six) hours as needed for up to 15 doses for severe pain. 01/27/21  Yes Devun Anna, Carola Rhine, MD  senna-docusate (SENOKOT-S) 8.6-50 MG tablet Take 1 tablet by mouth daily as needed for up to 15 doses for mild constipation. 01/27/21  Yes Oskar Cretella, Carola Rhine, MD  acetaminophen (TYLENOL 8 HOUR) 650 MG CR tablet Take 1 tablet (650 mg total) by mouth every 8 (eight) hours as needed for pain. 03/20/13   Sciacca, Marissa, PA-C  albuterol (PROVENTIL HFA;VENTOLIN HFA) 108 (90 BASE) MCG/ACT inhaler Inhale 2 puffs into the lungs every 6 (six) hours as needed for shortness of breath.    [provider]  hydrochlorothiazide (HYDRODIURIL) 25 MG tablet Take 25 mg by mouth daily.    [provider]  ibuprofen (ADVIL,MOTRIN) 200 MG tablet Take 400 mg by mouth every 6 (six) hours as needed for mild pain or moderate pain.    [provider]  lovastatin (MEVACOR) 20 MG tablet Take 20 mg by mouth at bedtime.    [provider]  megestrol (MEGACE) 40  MG tablet Take 1 tablet (40 mg total) by mouth 2 (two) times daily. 07/10/13   Terrance Mass, MD  metoCLOPramide (REGLAN) 10 MG tablet Take 1 tablet (10 mg total) by mouth 3 (three) times daily with meals. 06/22/13   Terrance Mass, MD  metoprolol succinate (TOPROL XL) 25 MG 24 hr tablet Take 1 tablet (25 mg total) by mouth daily. 12/06/17   Leonie Man, MD      Allergies    Patient has no known allergies.    Review of Systems   Review of Systems  Physical Exam Updated Vital Signs BP 126/66    Pulse 65    Temp 97.9 F (36.6 C) (Oral)    Resp 18    Ht 5\' 2"  (1.575 m)    Wt 108.9 kg    SpO2 98%    BMI 43.90 kg/m  Physical Exam Constitutional:      General: She is not in acute distress. HENT:     Head: Normocephalic and atraumatic.  Eyes:     Conjunctiva/sclera: Conjunctivae normal.     Pupils: Pupils are equal, round, and reactive to light.  Cardiovascular:     Rate and Rhythm: Normal rate and regular rhythm.  Pulmonary:     Effort: Pulmonary effort is normal. No respiratory distress.  Abdominal:     General: There is no distension.     Tenderness: There  is no abdominal tenderness. There is no right CVA tenderness, left CVA tenderness or guarding.  Musculoskeletal:     Comments: Pain is worse with movement, specifically sitting upright, not reproducible on musculoskeletal exam of the spine and back  Skin:    General: Skin is warm and dry.  Neurological:     General: No focal deficit present.     Mental Status: She is alert. Mental status is at baseline.  Psychiatric:        Mood and Affect: Mood normal.        Behavior: Behavior normal.    ED Results / Procedures / Treatments   Labs (all labs ordered are listed, but only abnormal results are displayed) Labs Reviewed  COMPREHENSIVE METABOLIC PANEL - Abnormal; Notable for the following components:      Result Value   Sodium 134 (*)    All other components within normal limits  URINALYSIS, ROUTINE W REFLEX  MICROSCOPIC - Abnormal; Notable for the following components:   Glucose, UA >=500 (*)    All other components within normal limits  CBC WITH DIFFERENTIAL/PLATELET  LIPASE, BLOOD  CBG MONITORING, ED    EKG None  Radiology CT Renal Stone Study  Result Date: 01/27/2021 CLINICAL DATA:  Right flank pain for 1 week, urinary frequency EXAM: CT ABDOMEN AND PELVIS WITHOUT CONTRAST TECHNIQUE: Multidetector CT imaging of the abdomen and pelvis was performed following the standard protocol without IV contrast. RADIATION DOSE REDUCTION: This exam was performed according to the departmental dose-optimization program which includes automated exposure control, adjustment of the mA and/or kV according to patient size and/or use of iterative reconstruction technique. COMPARISON:  03/20/2013 FINDINGS: Lower chest: No acute pleural or parenchymal lung disease. Hepatobiliary: Unremarkable unenhanced appearance of the liver and gallbladder. Pancreas: Unremarkable unenhanced appearance. Spleen: Stable subcentimeter hypodensity inferior aspect of the spleen, benign given long-term stability. Otherwise unremarkable unenhanced appearance. Adrenals/Urinary Tract: No urinary tract calculi or obstructive uropathy. Nodular thickening of the left adrenal gland lateral limb measuring 1 cm, density measuring 9 HU consistent with adenoma or hyperplasia. Right adrenals unremarkable. Bladder is grossly normal. Stomach/Bowel: No bowel obstruction or ileus. Normal appendix right lower quadrant. Scattered diverticulosis of the descending and sigmoid colon without diverticulitis. No bowel wall thickening or inflammatory change. Vascular/Lymphatic: Aortic atherosclerosis. No enlarged abdominal or pelvic lymph nodes. Reproductive: Status post hysterectomy. No adnexal masses. Other: No free fluid or free gas.  No abdominal wall hernia. Musculoskeletal: No acute or destructive bony lesions. Moderate lower lumbar spondylosis. Reconstructed images  demonstrate no additional findings. IMPRESSION: 1. No acute intra-abdominal or intrapelvic process. No urinary tract calculi or obstructive uropathy. 2. Distal colonic diverticulosis without diverticulitis. 3. 1 cm left adrenal mass, consistent with lipid-rich benign adenoma. No follow-up imaging is recommended. JACR 2017 Aug; 14(8):1038-44, JCAT 2016 Mar-Apr; 40(2):194-200, Urol J 2006 Spring; 3(2):71-4. 4.  Aortic Atherosclerosis (ICD10-I70.0). Electronically Signed   By: Randa Ngo M.D.   On: 01/27/2021 15:25    Procedures Procedures    Medications Ordered in ED Medications  oxyCODONE (Oxy IR/ROXICODONE) immediate release tablet 5 mg (5 mg Oral Given 01/27/21 1851)    ED Course/ Medical Decision Making/ A&P                           Medical Decision Making Risk OTC drugs. Prescription drug management.   This patient presents to the Emergency Department with complaint of abdominal pain. This involves an extensive number of treatment options,  and is a complaint that carries with it a high risk of complications and morbidity.  The differential diagnosis includes, but is not limited to, musculoskeletal pain versus gastritis vs biliary disease vs peptic ulcer vs constipation vs colitis vs UTI vs other  I ordered, reviewed, and interpreted labs, including CMP and CBC.  There were no immediate, life-threatening emergencies found in this labwork.  Patient's UA showed no signs of infection.  Lipase within normal limits I ordered medication \\Percocet   for abdominal pain and/or nausea I ordered imaging studies which included CT renal study I independently visualized and interpreted imaging which showed no focal findings to explain the patient's symptoms, specifically no evidence of biliary inflammation, no ureteral colic, no inflammation inside the abdomen and the monitor tracing which showed normal sinus rhythm Additional history was obtained from patient's son at bedside, lives with her,  provided supplemental history  After the interventions stated above, I reevaluated the patient and found that they remained clinically stable.  Based on the patient's clinical exam, vital signs, risk factors, and ED testing, I felt that the patient's overall risk of life-threatening emergency such as bowel perforation, surgical emergency, or sepsis was quite low.  I suspect this clinical presentation is most consistent with muscular/skeletal back or nerve pain, given her symptoms are reproducible with movement, but explained to the patient that this evaluation was not a definitive diagnostic workup.  I discussed outpatient follow up with primary care provider, and provided specialist office number on the patient's discharge paper if a referral was deemed necessary.  I discussed return precautions with the patient. I felt the patient was clinically stable for discharge.         Final Clinical Impression(s) / ED Diagnoses Final diagnoses:  Right flank pain    Rx / DC Orders ED Discharge Orders          Ordered    oxyCODONE (ROXICODONE) 5 MG immediate release tablet  Every 6 hours PRN        01/27/21 1852    senna-docusate (SENOKOT-S) 8.6-50 MG tablet  Daily PRN        01/27/21 1852              Wyvonnia Dusky, MD 01/27/21 2336
